# Patient Record
Sex: Male | Born: 1975 | Race: White | Hispanic: No | Smoking: Current every day smoker
Health system: Southern US, Community
[De-identification: ages and names within clinical notes are randomized; demographics above are authoritative.]

## PROBLEM LIST (undated history)

## (undated) DIAGNOSIS — K219 Gastro-esophageal reflux disease without esophagitis: Secondary | ICD-10-CM

## (undated) DIAGNOSIS — F419 Anxiety disorder, unspecified: Secondary | ICD-10-CM

## (undated) DIAGNOSIS — Z765 Malingerer [conscious simulation]: Secondary | ICD-10-CM

## (undated) DIAGNOSIS — T7840XA Allergy, unspecified, initial encounter: Secondary | ICD-10-CM

## (undated) DIAGNOSIS — E785 Hyperlipidemia, unspecified: Secondary | ICD-10-CM

## (undated) DIAGNOSIS — R7303 Prediabetes: Secondary | ICD-10-CM

## (undated) DIAGNOSIS — G4700 Insomnia, unspecified: Secondary | ICD-10-CM

## (undated) HISTORY — DX: Gastro-esophageal reflux disease without esophagitis: K21.9

## (undated) HISTORY — DX: Allergy, unspecified, initial encounter: T78.40XA

## (undated) HISTORY — DX: Hyperlipidemia, unspecified: E78.5

## (undated) HISTORY — DX: Anxiety disorder, unspecified: F41.9

---

## 1999-12-24 ENCOUNTER — Emergency Department (HOSPITAL_COMMUNITY): Admission: EM | Admit: 1999-12-24 | Discharge: 1999-12-24 | Payer: Self-pay | Admitting: Emergency Medicine

## 2000-03-13 ENCOUNTER — Emergency Department (HOSPITAL_COMMUNITY): Admission: EM | Admit: 2000-03-13 | Discharge: 2000-03-13 | Payer: Self-pay | Admitting: Emergency Medicine

## 2000-12-11 ENCOUNTER — Emergency Department (HOSPITAL_COMMUNITY): Admission: EM | Admit: 2000-12-11 | Discharge: 2000-12-11 | Payer: Self-pay | Admitting: Emergency Medicine

## 2001-02-20 ENCOUNTER — Emergency Department (HOSPITAL_COMMUNITY): Admission: EM | Admit: 2001-02-20 | Discharge: 2001-02-20 | Payer: Self-pay | Admitting: Emergency Medicine

## 2001-02-20 ENCOUNTER — Encounter: Payer: Self-pay | Admitting: Emergency Medicine

## 2001-11-20 ENCOUNTER — Emergency Department (HOSPITAL_COMMUNITY): Admission: EM | Admit: 2001-11-20 | Discharge: 2001-11-20 | Payer: Self-pay | Admitting: Emergency Medicine

## 2005-07-21 ENCOUNTER — Emergency Department (HOSPITAL_COMMUNITY): Admission: EM | Admit: 2005-07-21 | Discharge: 2005-07-21 | Payer: Self-pay | Admitting: Emergency Medicine

## 2006-02-25 ENCOUNTER — Emergency Department (HOSPITAL_COMMUNITY): Admission: EM | Admit: 2006-02-25 | Discharge: 2006-02-25 | Payer: Self-pay | Admitting: Emergency Medicine

## 2006-03-07 ENCOUNTER — Emergency Department: Payer: Self-pay | Admitting: Unknown Physician Specialty

## 2006-05-11 ENCOUNTER — Emergency Department: Payer: Self-pay | Admitting: Internal Medicine

## 2006-05-26 ENCOUNTER — Emergency Department (HOSPITAL_COMMUNITY): Admission: EM | Admit: 2006-05-26 | Discharge: 2006-05-26 | Payer: Self-pay | Admitting: Emergency Medicine

## 2006-07-08 ENCOUNTER — Emergency Department (HOSPITAL_COMMUNITY): Admission: EM | Admit: 2006-07-08 | Discharge: 2006-07-08 | Payer: Self-pay | Admitting: Emergency Medicine

## 2006-09-03 ENCOUNTER — Emergency Department (HOSPITAL_COMMUNITY): Admission: EM | Admit: 2006-09-03 | Discharge: 2006-09-03 | Payer: Self-pay | Admitting: Emergency Medicine

## 2006-09-24 ENCOUNTER — Emergency Department (HOSPITAL_COMMUNITY): Admission: EM | Admit: 2006-09-24 | Discharge: 2006-09-24 | Payer: Self-pay | Admitting: Emergency Medicine

## 2007-03-25 ENCOUNTER — Emergency Department (HOSPITAL_COMMUNITY): Admission: EM | Admit: 2007-03-25 | Discharge: 2007-03-25 | Payer: Self-pay | Admitting: Emergency Medicine

## 2007-03-26 ENCOUNTER — Emergency Department (HOSPITAL_COMMUNITY): Admission: EM | Admit: 2007-03-26 | Discharge: 2007-03-26 | Payer: Self-pay | Admitting: Emergency Medicine

## 2007-04-16 ENCOUNTER — Emergency Department (HOSPITAL_COMMUNITY): Admission: EM | Admit: 2007-04-16 | Discharge: 2007-04-16 | Payer: Self-pay | Admitting: Emergency Medicine

## 2007-04-28 ENCOUNTER — Emergency Department (HOSPITAL_COMMUNITY): Admission: EM | Admit: 2007-04-28 | Discharge: 2007-04-28 | Payer: Self-pay | Admitting: Emergency Medicine

## 2007-05-03 ENCOUNTER — Emergency Department (HOSPITAL_COMMUNITY): Admission: EM | Admit: 2007-05-03 | Discharge: 2007-05-03 | Payer: Self-pay | Admitting: Emergency Medicine

## 2007-06-03 ENCOUNTER — Emergency Department (HOSPITAL_COMMUNITY): Admission: EM | Admit: 2007-06-03 | Discharge: 2007-06-03 | Payer: Self-pay | Admitting: Emergency Medicine

## 2007-06-30 ENCOUNTER — Emergency Department (HOSPITAL_COMMUNITY): Admission: EM | Admit: 2007-06-30 | Discharge: 2007-06-30 | Payer: Self-pay | Admitting: Emergency Medicine

## 2007-07-03 ENCOUNTER — Emergency Department: Payer: Self-pay | Admitting: Emergency Medicine

## 2007-07-07 ENCOUNTER — Emergency Department (HOSPITAL_COMMUNITY): Admission: EM | Admit: 2007-07-07 | Discharge: 2007-07-07 | Payer: Self-pay | Admitting: Emergency Medicine

## 2007-08-01 ENCOUNTER — Emergency Department (HOSPITAL_COMMUNITY): Admission: EM | Admit: 2007-08-01 | Discharge: 2007-08-01 | Payer: Self-pay | Admitting: Emergency Medicine

## 2007-09-06 ENCOUNTER — Emergency Department (HOSPITAL_COMMUNITY): Admission: EM | Admit: 2007-09-06 | Discharge: 2007-09-06 | Payer: Self-pay | Admitting: Emergency Medicine

## 2007-10-04 ENCOUNTER — Emergency Department (HOSPITAL_COMMUNITY): Admission: EM | Admit: 2007-10-04 | Discharge: 2007-10-04 | Payer: Self-pay | Admitting: Emergency Medicine

## 2007-10-14 ENCOUNTER — Emergency Department: Payer: Self-pay | Admitting: Internal Medicine

## 2007-10-14 ENCOUNTER — Emergency Department (HOSPITAL_COMMUNITY): Admission: EM | Admit: 2007-10-14 | Discharge: 2007-10-14 | Payer: Self-pay | Admitting: Emergency Medicine

## 2007-11-07 ENCOUNTER — Emergency Department (HOSPITAL_COMMUNITY): Admission: EM | Admit: 2007-11-07 | Discharge: 2007-11-07 | Payer: Self-pay | Admitting: Emergency Medicine

## 2007-12-04 ENCOUNTER — Emergency Department (HOSPITAL_COMMUNITY): Admission: EM | Admit: 2007-12-04 | Discharge: 2007-12-04 | Payer: Self-pay | Admitting: Emergency Medicine

## 2008-01-12 ENCOUNTER — Emergency Department (HOSPITAL_COMMUNITY): Admission: EM | Admit: 2008-01-12 | Discharge: 2008-01-12 | Payer: Self-pay | Admitting: Emergency Medicine

## 2008-01-20 ENCOUNTER — Emergency Department (HOSPITAL_COMMUNITY): Admission: EM | Admit: 2008-01-20 | Discharge: 2008-01-20 | Payer: Self-pay | Admitting: Emergency Medicine

## 2008-01-30 ENCOUNTER — Emergency Department (HOSPITAL_COMMUNITY): Admission: EM | Admit: 2008-01-30 | Discharge: 2008-01-30 | Payer: Self-pay | Admitting: Emergency Medicine

## 2008-03-17 ENCOUNTER — Emergency Department (HOSPITAL_BASED_OUTPATIENT_CLINIC_OR_DEPARTMENT_OTHER): Admission: EM | Admit: 2008-03-17 | Discharge: 2008-03-17 | Payer: Self-pay | Admitting: Emergency Medicine

## 2008-03-18 ENCOUNTER — Emergency Department (HOSPITAL_BASED_OUTPATIENT_CLINIC_OR_DEPARTMENT_OTHER): Admission: EM | Admit: 2008-03-18 | Discharge: 2008-03-18 | Payer: Self-pay | Admitting: Emergency Medicine

## 2008-04-18 ENCOUNTER — Emergency Department (HOSPITAL_BASED_OUTPATIENT_CLINIC_OR_DEPARTMENT_OTHER): Admission: EM | Admit: 2008-04-18 | Discharge: 2008-04-18 | Payer: Self-pay | Admitting: Emergency Medicine

## 2008-05-22 ENCOUNTER — Emergency Department (HOSPITAL_BASED_OUTPATIENT_CLINIC_OR_DEPARTMENT_OTHER): Admission: EM | Admit: 2008-05-22 | Discharge: 2008-05-22 | Payer: Self-pay | Admitting: Emergency Medicine

## 2008-07-19 ENCOUNTER — Emergency Department (HOSPITAL_BASED_OUTPATIENT_CLINIC_OR_DEPARTMENT_OTHER): Admission: EM | Admit: 2008-07-19 | Discharge: 2008-07-19 | Payer: Self-pay | Admitting: Emergency Medicine

## 2008-07-31 ENCOUNTER — Emergency Department (HOSPITAL_BASED_OUTPATIENT_CLINIC_OR_DEPARTMENT_OTHER): Admission: EM | Admit: 2008-07-31 | Discharge: 2008-07-31 | Payer: Self-pay | Admitting: Emergency Medicine

## 2008-08-07 ENCOUNTER — Emergency Department (HOSPITAL_BASED_OUTPATIENT_CLINIC_OR_DEPARTMENT_OTHER): Admission: EM | Admit: 2008-08-07 | Discharge: 2008-08-07 | Payer: Self-pay | Admitting: Emergency Medicine

## 2008-08-10 ENCOUNTER — Emergency Department (HOSPITAL_BASED_OUTPATIENT_CLINIC_OR_DEPARTMENT_OTHER): Admission: EM | Admit: 2008-08-10 | Discharge: 2008-08-10 | Payer: Self-pay | Admitting: *Deleted

## 2008-10-02 ENCOUNTER — Emergency Department (HOSPITAL_BASED_OUTPATIENT_CLINIC_OR_DEPARTMENT_OTHER): Admission: EM | Admit: 2008-10-02 | Discharge: 2008-10-02 | Payer: Self-pay | Admitting: Emergency Medicine

## 2008-10-14 ENCOUNTER — Emergency Department (HOSPITAL_COMMUNITY): Admission: EM | Admit: 2008-10-14 | Discharge: 2008-10-14 | Payer: Self-pay | Admitting: Emergency Medicine

## 2008-11-27 ENCOUNTER — Emergency Department (HOSPITAL_BASED_OUTPATIENT_CLINIC_OR_DEPARTMENT_OTHER): Admission: EM | Admit: 2008-11-27 | Discharge: 2008-11-27 | Payer: Self-pay | Admitting: Emergency Medicine

## 2009-07-08 ENCOUNTER — Emergency Department (HOSPITAL_COMMUNITY): Admission: EM | Admit: 2009-07-08 | Discharge: 2009-07-08 | Payer: Self-pay | Admitting: Emergency Medicine

## 2009-09-03 ENCOUNTER — Emergency Department (HOSPITAL_COMMUNITY): Admission: EM | Admit: 2009-09-03 | Discharge: 2009-09-04 | Payer: Self-pay | Admitting: Emergency Medicine

## 2009-09-14 ENCOUNTER — Emergency Department (HOSPITAL_BASED_OUTPATIENT_CLINIC_OR_DEPARTMENT_OTHER): Admission: EM | Admit: 2009-09-14 | Discharge: 2009-09-14 | Payer: Self-pay | Admitting: Emergency Medicine

## 2009-09-25 ENCOUNTER — Emergency Department (HOSPITAL_COMMUNITY): Admission: EM | Admit: 2009-09-25 | Discharge: 2009-09-25 | Payer: Self-pay | Admitting: Emergency Medicine

## 2009-10-15 ENCOUNTER — Emergency Department (HOSPITAL_COMMUNITY): Admission: EM | Admit: 2009-10-15 | Discharge: 2009-10-15 | Payer: Self-pay | Admitting: Family Medicine

## 2009-10-21 ENCOUNTER — Emergency Department (HOSPITAL_COMMUNITY): Admission: EM | Admit: 2009-10-21 | Discharge: 2009-10-21 | Payer: Self-pay | Admitting: Emergency Medicine

## 2009-12-06 ENCOUNTER — Ambulatory Visit: Payer: Self-pay | Admitting: Family Medicine

## 2010-01-28 ENCOUNTER — Ambulatory Visit: Payer: Self-pay | Admitting: Diagnostic Radiology

## 2010-01-28 ENCOUNTER — Emergency Department (HOSPITAL_BASED_OUTPATIENT_CLINIC_OR_DEPARTMENT_OTHER): Admission: EM | Admit: 2010-01-28 | Discharge: 2010-01-29 | Payer: Self-pay | Admitting: Emergency Medicine

## 2010-03-21 ENCOUNTER — Emergency Department (HOSPITAL_COMMUNITY): Admission: EM | Admit: 2010-03-21 | Discharge: 2010-03-21 | Payer: Self-pay | Admitting: Emergency Medicine

## 2010-04-12 ENCOUNTER — Emergency Department (HOSPITAL_COMMUNITY): Admission: EM | Admit: 2010-04-12 | Discharge: 2010-04-12 | Payer: Self-pay | Admitting: Emergency Medicine

## 2010-04-23 ENCOUNTER — Emergency Department (HOSPITAL_COMMUNITY)
Admission: EM | Admit: 2010-04-23 | Discharge: 2010-04-23 | Payer: Self-pay | Source: Home / Self Care | Admitting: Emergency Medicine

## 2010-08-18 ENCOUNTER — Emergency Department (HOSPITAL_COMMUNITY): Admission: EM | Admit: 2010-08-18 | Discharge: 2010-01-08 | Payer: Self-pay | Admitting: Emergency Medicine

## 2010-09-29 ENCOUNTER — Observation Stay (HOSPITAL_COMMUNITY)
Admission: EM | Admit: 2010-09-29 | Discharge: 2010-09-30 | Payer: Self-pay | Source: Home / Self Care | Attending: Pulmonary Disease | Admitting: Pulmonary Disease

## 2010-10-02 ENCOUNTER — Encounter: Payer: Self-pay | Admitting: Emergency Medicine

## 2010-10-03 LAB — BASIC METABOLIC PANEL
Calcium: 9.2 mg/dL (ref 8.4–10.5)
Creatinine, Ser: 1.17 mg/dL (ref 0.4–1.5)
Glucose, Bld: 86 mg/dL (ref 70–99)

## 2010-10-03 LAB — POCT I-STAT, CHEM 8
Calcium, Ion: 1.06 mmol/L — ABNORMAL LOW (ref 1.12–1.32)
Creatinine, Ser: 1.4 mg/dL (ref 0.4–1.5)
Hemoglobin: 14.3 g/dL (ref 13.0–17.0)
Potassium: 4.5 mEq/L (ref 3.5–5.1)
Sodium: 137 mEq/L (ref 135–145)
TCO2: 27 mmol/L (ref 0–100)

## 2010-10-03 LAB — POCT CARDIAC MARKERS
CKMB, poc: <1 ng/mL — ABNORMAL LOW (ref 1.0–8.0)
Myoglobin, poc: 75.1 ng/mL (ref 12–200)
Myoglobin, poc: 69.4 ng/mL (ref 12–200)
Troponin i, poc: <0.05 ng/mL (ref 0.00–0.09)

## 2010-10-03 LAB — CBC
Hemoglobin: 15 g/dL (ref 13.0–17.0)
Platelets: 199 10*3/uL (ref 150–400)

## 2010-10-03 LAB — DIFFERENTIAL
Eosinophils Absolute: 0.6 10*3/uL (ref 0.0–0.7)
Eosinophils Relative: 6 % — ABNORMAL HIGH (ref 0–5)
Lymphocytes Relative: 29 % (ref 12–46)
Lymphs Abs: 2.7 10*3/uL (ref 0.7–4.0)
Monocytes Absolute: 0.7 10*3/uL (ref 0.1–1.0)
Monocytes Relative: 8 % (ref 3–12)
Neutrophils Relative %: 57 % (ref 43–77)

## 2010-10-07 ENCOUNTER — Emergency Department (HOSPITAL_COMMUNITY)
Admission: EM | Admit: 2010-10-07 | Discharge: 2010-10-07 | Payer: Self-pay | Source: Home / Self Care | Admitting: Emergency Medicine

## 2010-11-25 LAB — DIFFERENTIAL
Basophils Absolute: 0 10*3/uL (ref 0.0–0.1)
Eosinophils Absolute: 0.5 10*3/uL (ref 0.0–0.7)
Eosinophils Relative: 6 % — ABNORMAL HIGH (ref 0–5)

## 2010-11-25 LAB — CBC
MCH: 32.7 pg (ref 26.0–34.0)
MCV: 90.5 fL (ref 78.0–100.0)
RDW: 13.2 % (ref 11.5–15.5)
WBC: 8.5 10*3/uL (ref 4.0–10.5)

## 2010-11-25 LAB — BASIC METABOLIC PANEL
BUN: 7 mg/dL (ref 6–23)
CO2: 24 mEq/L (ref 19–32)
Calcium: 8.5 mg/dL (ref 8.4–10.5)
Chloride: 108 mEq/L (ref 96–112)
GFR calc Af Amer: >60 mL/min (ref >60)
Potassium: 3.6 mEq/L (ref 3.5–5.1)
Sodium: 140 mEq/L (ref 135–145)

## 2010-11-27 LAB — DIFFERENTIAL
Basophils Absolute: 0 10*3/uL (ref 0.0–0.1)
Eosinophils Absolute: 0.3 10*3/uL (ref 0.0–0.7)
Lymphocytes Relative: 9 % — ABNORMAL LOW (ref 12–46)
Lymphs Abs: 1.1 10*3/uL (ref 0.7–4.0)
Neutrophils Relative %: 81 % — ABNORMAL HIGH (ref 43–77)

## 2010-11-27 LAB — CBC
Platelets: 203 10*3/uL (ref 150–400)
RDW: 12.9 % (ref 11.5–15.5)
WBC: 12.6 10*3/uL — ABNORMAL HIGH (ref 4.0–10.5)

## 2010-11-27 LAB — POCT I-STAT, CHEM 8
BUN: 10 mg/dL (ref 6–23)
Creatinine, Ser: 1 mg/dL (ref 0.4–1.5)
Hemoglobin: 15.3 g/dL (ref 13.0–17.0)
Potassium: 3.6 mEq/L (ref 3.5–5.1)
Sodium: 137 mEq/L (ref 135–145)

## 2010-11-27 LAB — RAPID STREP SCREEN (MED CTR MEBANE ONLY): Streptococcus, Group A Screen (Direct): NEGATIVE

## 2010-11-28 LAB — DIFFERENTIAL
Lymphocytes Relative: 18 % (ref 12–46)
Lymphs Abs: 1.8 10*3/uL (ref 0.7–4.0)
Monocytes Absolute: 0.6 10*3/uL (ref 0.1–1.0)
Monocytes Relative: 5 % (ref 3–12)
Neutro Abs: 7.3 10*3/uL (ref 1.7–7.7)
Neutrophils Relative %: 71 % (ref 43–77)

## 2010-11-28 LAB — BASIC METABOLIC PANEL
CO2: 25 mEq/L (ref 19–32)
Calcium: 9 mg/dL (ref 8.4–10.5)
Creatinine, Ser: 1 mg/dL (ref 0.4–1.5)
GFR calc Af Amer: >60 mL/min (ref >60)
Sodium: 144 mEq/L (ref 135–145)

## 2010-11-28 LAB — CBC
Hemoglobin: 15.3 g/dL (ref 13.0–17.0)
MCHC: 34.3 g/dL (ref 30.0–36.0)
RBC: 4.8 MIL/uL (ref 4.22–5.81)
WBC: 10.3 10*3/uL (ref 4.0–10.5)

## 2010-11-29 LAB — CBC
HCT: 41.6 % (ref 39.0–52.0)
Hemoglobin: 14.8 g/dL (ref 13.0–17.0)
MCHC: 35.6 g/dL (ref 30.0–36.0)
RBC: 4.42 MIL/uL (ref 4.22–5.81)
RDW: 13.3 % (ref 11.5–15.5)

## 2010-11-29 LAB — DIFFERENTIAL
Basophils Absolute: 0.1 10*3/uL (ref 0.0–0.1)
Basophils Relative: 1 % (ref 0–1)
Eosinophils Relative: 5 % (ref 0–5)
Monocytes Absolute: 0.7 10*3/uL (ref 0.1–1.0)
Monocytes Relative: 7 % (ref 3–12)
Neutro Abs: 5.6 10*3/uL (ref 1.7–7.7)

## 2010-11-29 LAB — POCT CARDIAC MARKERS
CKMB, poc: <1 ng/mL — ABNORMAL LOW (ref 1.0–8.0)
CKMB, poc: <1 ng/mL — ABNORMAL LOW (ref 1.0–8.0)
Myoglobin, poc: 58.7 ng/mL (ref 12–200)
Troponin i, poc: <0.05 ng/mL (ref 0.00–0.09)
Troponin i, poc: <0.05 ng/mL (ref 0.00–0.09)

## 2010-11-29 LAB — BASIC METABOLIC PANEL
CO2: 26 mEq/L (ref 19–32)
Calcium: 9.2 mg/dL (ref 8.4–10.5)
Chloride: 106 mEq/L (ref 96–112)
GFR calc Af Amer: >60 mL/min (ref >60)
Glucose, Bld: 126 mg/dL — ABNORMAL HIGH (ref 70–99)
Potassium: 3.7 mEq/L (ref 3.5–5.1)
Sodium: 139 mEq/L (ref 135–145)

## 2010-12-22 LAB — URINALYSIS, ROUTINE W REFLEX MICROSCOPIC
Bilirubin Urine: NEGATIVE
Glucose, UA: NEGATIVE mg/dL
Hgb urine dipstick: NEGATIVE
Ketones, ur: NEGATIVE mg/dL
Nitrite: NEGATIVE
Protein, ur: NEGATIVE mg/dL
Specific Gravity, Urine: 1.005 (ref 1.005–1.030)
Urobilinogen, UA: 0.2 mg/dL (ref 0.0–1.0)
pH: 6 (ref 5.0–8.0)

## 2010-12-22 LAB — CBC
HCT: 46 % (ref 39.0–52.0)
Hemoglobin: 16 g/dL (ref 13.0–17.0)
MCHC: 34.7 g/dL (ref 30.0–36.0)
MCV: 92.7 fL (ref 78.0–100.0)
Platelets: 192 10*3/uL (ref 150–400)
RBC: 4.97 MIL/uL (ref 4.22–5.81)
RDW: 12.9 % (ref 11.5–15.5)
WBC: 10.8 10*3/uL — ABNORMAL HIGH (ref 4.0–10.5)

## 2010-12-22 LAB — COMPREHENSIVE METABOLIC PANEL
ALT: 30 U/L (ref 0–53)
AST: 25 U/L (ref 0–37)
Albumin: 4.4 g/dL (ref 3.5–5.2)
Alkaline Phosphatase: 87 U/L (ref 39–117)
BUN: 7 mg/dL (ref 6–23)
CO2: 28 mEq/L (ref 19–32)
Calcium: 9.2 mg/dL (ref 8.4–10.5)
Chloride: 105 mEq/L (ref 96–112)
Creatinine, Ser: 1 mg/dL (ref 0.4–1.5)
GFR calc Af Amer: >60 mL/min (ref >60)
GFR calc non Af Amer: >60 mL/min (ref >60)
Glucose, Bld: 97 mg/dL (ref 70–99)
Potassium: 3.6 mEq/L (ref 3.5–5.1)
Sodium: 141 mEq/L (ref 135–145)
Total Bilirubin: 0.6 mg/dL (ref 0.3–1.2)
Total Protein: 7.7 g/dL (ref 6.0–8.3)

## 2010-12-22 LAB — DIFFERENTIAL
Basophils Absolute: 0.1 10*3/uL (ref 0.0–0.1)
Lymphocytes Relative: 10 % — ABNORMAL LOW (ref 12–46)
Lymphs Abs: 1.1 10*3/uL (ref 0.7–4.0)
Monocytes Absolute: 0.5 10*3/uL (ref 0.1–1.0)
Monocytes Relative: 4 % (ref 3–12)
Neutro Abs: 8.6 10*3/uL — ABNORMAL HIGH (ref 1.7–7.7)

## 2010-12-22 LAB — LIPASE, BLOOD: Lipase: 44 U/L (ref 23–300)

## 2010-12-22 LAB — HEMOCCULT GUIAC POC 1CARD (OFFICE): Fecal Occult Bld: NEGATIVE

## 2010-12-26 LAB — GLUCOSE, CAPILLARY

## 2011-04-16 ENCOUNTER — Observation Stay (HOSPITAL_COMMUNITY)
Admission: EM | Admit: 2011-04-16 | Discharge: 2011-04-17 | Discharge status: Against Medical Advice | Payer: Self-pay | Attending: Emergency Medicine | Admitting: Emergency Medicine

## 2011-04-16 ENCOUNTER — Emergency Department (HOSPITAL_COMMUNITY): Payer: Self-pay

## 2011-04-16 DIAGNOSIS — R61 Generalized hyperhidrosis: Secondary | ICD-10-CM | POA: Insufficient documentation

## 2011-04-16 DIAGNOSIS — R110 Nausea: Secondary | ICD-10-CM | POA: Insufficient documentation

## 2011-04-16 DIAGNOSIS — R0609 Other forms of dyspnea: Secondary | ICD-10-CM | POA: Insufficient documentation

## 2011-04-16 DIAGNOSIS — R0989 Other specified symptoms and signs involving the circulatory and respiratory systems: Secondary | ICD-10-CM | POA: Insufficient documentation

## 2011-04-16 DIAGNOSIS — R079 Chest pain, unspecified: Principal | ICD-10-CM | POA: Insufficient documentation

## 2011-04-16 LAB — BASIC METABOLIC PANEL
BUN: 13 mg/dL (ref 6–23)
CO2: 26 mEq/L (ref 19–32)
Calcium: 9.5 mg/dL (ref 8.4–10.5)
Chloride: 102 mEq/L (ref 96–112)
Creatinine, Ser: 0.9 mg/dL (ref 0.50–1.35)

## 2011-04-16 LAB — TROPONIN I
Troponin I: <0.3 ng/mL (ref <0.30)
Troponin I: <0.3 ng/mL (ref <0.30)

## 2011-04-16 LAB — URINALYSIS, ROUTINE W REFLEX MICROSCOPIC
Bilirubin Urine: NEGATIVE
Glucose, UA: NEGATIVE mg/dL
Hgb urine dipstick: NEGATIVE
Urobilinogen, UA: 0.2 mg/dL (ref 0.0–1.0)

## 2011-04-16 LAB — CBC
Hemoglobin: 15.3 g/dL (ref 13.0–17.0)
MCHC: 36.5 g/dL — ABNORMAL HIGH (ref 30.0–36.0)
WBC: 8.3 10*3/uL (ref 4.0–10.5)

## 2011-04-16 LAB — TSH: TSH: 2.041 u[IU]/mL (ref 0.350–4.500)

## 2011-04-16 LAB — D-DIMER, QUANTITATIVE: D-Dimer, Quant: <0.22 ug/mL-FEU (ref 0.00–0.48)

## 2011-04-16 LAB — T4, FREE: Free T4: 0.99 ng/dL (ref 0.80–1.80)

## 2011-04-16 LAB — CK TOTAL AND CKMB (NOT AT ARMC): Relative Index: INVALID (ref 0.0–2.5)

## 2011-04-17 LAB — CK TOTAL AND CKMB (NOT AT ARMC)
CK, MB: 1.9 ng/mL (ref 0.3–4.0)
Total CK: 87 U/L (ref 7–232)

## 2011-06-01 LAB — URINALYSIS, ROUTINE W REFLEX MICROSCOPIC
Bilirubin Urine: NEGATIVE
Glucose, UA: NEGATIVE
Hgb urine dipstick: NEGATIVE
Ketones, ur: NEGATIVE
Protein, ur: NEGATIVE
pH: 6

## 2011-06-01 LAB — POCT CARDIAC MARKERS
CKMB, poc: <1 — ABNORMAL LOW
Troponin i, poc: <0.05

## 2011-06-07 LAB — POCT CARDIAC MARKERS
CKMB, poc: 1.1
Myoglobin, poc: 99.7
Operator id: 294501
Troponin i, poc: <0.05

## 2011-06-08 LAB — BASIC METABOLIC PANEL
BUN: 12
Creatinine, Ser: 1
GFR calc non Af Amer: >60
Glucose, Bld: 114 — ABNORMAL HIGH
Potassium: 4

## 2011-06-08 LAB — POCT CARDIAC MARKERS
CKMB, poc: 1.5
Operator id: 1206

## 2011-06-08 LAB — DIFFERENTIAL
Basophils Absolute: 0
Eosinophils Absolute: 0.5
Eosinophils Relative: 5
Lymphocytes Relative: 28
Neutrophils Relative %: 60

## 2011-06-08 LAB — CBC
HCT: 44
Platelets: 203
RDW: 12.7
WBC: 8.9

## 2011-06-08 LAB — D-DIMER, QUANTITATIVE: D-Dimer, Quant: <0.22

## 2011-06-09 LAB — CBC
HCT: 45.9
MCHC: 35.2
MCV: 91.1
Platelets: 206
RDW: 12.4

## 2011-06-09 LAB — BASIC METABOLIC PANEL
BUN: 11
Chloride: 104
Creatinine, Ser: 1.1
Glucose, Bld: 111 — ABNORMAL HIGH
Potassium: 3.6

## 2011-06-09 LAB — DIFFERENTIAL
Basophils Absolute: 0
Basophils Relative: 0
Eosinophils Absolute: 0.5
Eosinophils Relative: 4

## 2011-06-09 LAB — D-DIMER, QUANTITATIVE: D-Dimer, Quant: <0.22

## 2011-06-14 LAB — URINALYSIS, ROUTINE W REFLEX MICROSCOPIC
Bilirubin Urine: NEGATIVE
Glucose, UA: NEGATIVE
Hgb urine dipstick: NEGATIVE
Specific Gravity, Urine: 1.004 — ABNORMAL LOW
Urobilinogen, UA: 0.2
pH: 6

## 2011-06-14 LAB — DIFFERENTIAL
Basophils Relative: 1
Eosinophils Absolute: 0.6
Lymphs Abs: 2.1
Neutrophils Relative %: 61

## 2011-06-14 LAB — CBC
HCT: 40.3
MCHC: 35.2
MCV: 91.9
Platelets: 183
RDW: 12.3
WBC: 8.6

## 2011-06-14 LAB — LIPASE, BLOOD: Lipase: 60

## 2011-06-14 LAB — OCCULT BLOOD X 1 CARD TO LAB, STOOL: Fecal Occult Bld: NEGATIVE

## 2011-06-14 LAB — COMPREHENSIVE METABOLIC PANEL
Albumin: 4.4
BUN: 12
Chloride: 106
Creatinine, Ser: 1
Total Bilirubin: 0.4

## 2011-06-20 LAB — DIFFERENTIAL
Basophils Relative: 1
Eosinophils Absolute: 0.4
Lymphs Abs: 1.4
Monocytes Absolute: 0.7
Monocytes Relative: 7
Neutrophils Relative %: 75

## 2011-06-20 LAB — BASIC METABOLIC PANEL
CO2: 25
Chloride: 105
Creatinine, Ser: 0.89
GFR calc Af Amer: >60
Potassium: 3.9
Sodium: 137

## 2011-06-20 LAB — CBC
HCT: 44
Hemoglobin: 15.2
MCHC: 34.5
MCV: 93.1
RBC: 4.73
WBC: 10.2

## 2011-06-20 LAB — POCT CARDIAC MARKERS
Operator id: 146091
Troponin i, poc: <0.05

## 2011-06-21 LAB — URINALYSIS, ROUTINE W REFLEX MICROSCOPIC
Glucose, UA: NEGATIVE
Hgb urine dipstick: NEGATIVE
pH: 6.5

## 2011-06-21 LAB — DIFFERENTIAL
Eosinophils Relative: 8 — ABNORMAL HIGH
Lymphocytes Relative: 25
Lymphs Abs: 2.6
Monocytes Relative: 6
Neutrophils Relative %: 60

## 2011-06-21 LAB — CBC
HCT: 43
Platelets: 236
RBC: 4.66
WBC: 10.5

## 2011-06-22 LAB — URINALYSIS, ROUTINE W REFLEX MICROSCOPIC
Nitrite: NEGATIVE
Specific Gravity, Urine: 1.005
pH: 6.5

## 2011-06-26 LAB — BASIC METABOLIC PANEL
BUN: 6
GFR calc Af Amer: >60
GFR calc non Af Amer: >60
Potassium: 3.8
Sodium: 141

## 2011-06-26 LAB — CBC
HCT: 43.1
Hemoglobin: 15.3
Platelets: 224
RBC: 4.8
WBC: 9.6

## 2011-06-26 LAB — DIFFERENTIAL
Eosinophils Relative: 2
Lymphocytes Relative: 21
Lymphs Abs: 2

## 2011-06-26 LAB — POCT CARDIAC MARKERS
Operator id: 4761
Troponin i, poc: <0.05

## 2011-06-27 LAB — POCT CARDIAC MARKERS
CKMB, poc: <1 — ABNORMAL LOW
Operator id: 277751
Troponin i, poc: <0.05

## 2011-06-27 LAB — CBC
HCT: 46.3
Platelets: 250
WBC: 12 — ABNORMAL HIGH

## 2011-06-27 LAB — D-DIMER, QUANTITATIVE: D-Dimer, Quant: <0.22

## 2011-06-27 LAB — DIFFERENTIAL
Eosinophils Relative: 4
Lymphocytes Relative: 36
Lymphs Abs: 4.3 — ABNORMAL HIGH
Neutrophils Relative %: 54

## 2011-07-03 ENCOUNTER — Encounter: Payer: Self-pay | Admitting: Family Medicine

## 2011-10-15 ENCOUNTER — Encounter (HOSPITAL_COMMUNITY): Payer: Self-pay | Admitting: Emergency Medicine

## 2011-10-15 ENCOUNTER — Emergency Department (HOSPITAL_COMMUNITY)
Admission: EM | Admit: 2011-10-15 | Discharge: 2011-10-15 | Disposition: A | Discharge status: Home / Self Care | Payer: Self-pay | Attending: Emergency Medicine | Admitting: Emergency Medicine

## 2011-10-15 ENCOUNTER — Emergency Department (HOSPITAL_COMMUNITY): Payer: Self-pay

## 2011-10-15 DIAGNOSIS — K219 Gastro-esophageal reflux disease without esophagitis: Secondary | ICD-10-CM | POA: Insufficient documentation

## 2011-10-15 DIAGNOSIS — H538 Other visual disturbances: Secondary | ICD-10-CM | POA: Insufficient documentation

## 2011-10-15 DIAGNOSIS — G4700 Insomnia, unspecified: Secondary | ICD-10-CM | POA: Insufficient documentation

## 2011-10-15 DIAGNOSIS — R7303 Prediabetes: Secondary | ICD-10-CM | POA: Insufficient documentation

## 2011-10-15 DIAGNOSIS — R631 Polydipsia: Secondary | ICD-10-CM | POA: Insufficient documentation

## 2011-10-15 DIAGNOSIS — F411 Generalized anxiety disorder: Secondary | ICD-10-CM | POA: Insufficient documentation

## 2011-10-15 DIAGNOSIS — R51 Headache: Secondary | ICD-10-CM | POA: Insufficient documentation

## 2011-10-15 DIAGNOSIS — Z79899 Other long term (current) drug therapy: Secondary | ICD-10-CM | POA: Insufficient documentation

## 2011-10-15 DIAGNOSIS — R209 Unspecified disturbances of skin sensation: Secondary | ICD-10-CM | POA: Insufficient documentation

## 2011-10-15 DIAGNOSIS — E785 Hyperlipidemia, unspecified: Secondary | ICD-10-CM | POA: Insufficient documentation

## 2011-10-15 HISTORY — DX: Gastro-esophageal reflux disease without esophagitis: K21.9

## 2011-10-15 HISTORY — DX: Insomnia, unspecified: G47.00

## 2011-10-15 HISTORY — DX: Prediabetes: R73.03

## 2011-10-15 LAB — CBC
HCT: 39.2 % (ref 39.0–52.0)
Hemoglobin: 14.4 g/dL (ref 13.0–17.0)
MCH: 32.5 pg (ref 26.0–34.0)
MCHC: 36.7 g/dL — ABNORMAL HIGH (ref 30.0–36.0)
MCV: 88.5 fL (ref 78.0–100.0)
Platelets: 184 10*3/uL (ref 150–400)
RBC: 4.43 MIL/uL (ref 4.22–5.81)
RDW: 12.8 % (ref 11.5–15.5)
WBC: 9 10*3/uL (ref 4.0–10.5)

## 2011-10-15 LAB — COMPREHENSIVE METABOLIC PANEL
ALT: 30 U/L (ref 0–53)
AST: 7 U/L (ref 0–37)
Albumin: 3.7 g/dL (ref 3.5–5.2)
Alkaline Phosphatase: 87 U/L (ref 39–117)
BUN: 14 mg/dL (ref 6–23)
CO2: 23 mEq/L (ref 19–32)
Calcium: 9.1 mg/dL (ref 8.4–10.5)
Chloride: 99 mEq/L (ref 96–112)
Creatinine, Ser: 0.94 mg/dL (ref 0.50–1.35)
GFR calc Af Amer: >90 mL/min (ref >90)
GFR calc non Af Amer: >90 mL/min (ref >90)
Glucose, Bld: 104 mg/dL — ABNORMAL HIGH (ref 70–99)
Potassium: 3.7 mEq/L (ref 3.5–5.1)
Sodium: 135 mEq/L (ref 135–145)
Total Bilirubin: 0.3 mg/dL (ref 0.3–1.2)
Total Protein: 6.6 g/dL (ref 6.0–8.3)

## 2011-10-15 LAB — URINALYSIS, ROUTINE W REFLEX MICROSCOPIC
Bilirubin Urine: NEGATIVE
Glucose, UA: NEGATIVE mg/dL
Hgb urine dipstick: NEGATIVE
Ketones, ur: NEGATIVE mg/dL
Leukocytes, UA: NEGATIVE
Nitrite: NEGATIVE
Protein, ur: NEGATIVE mg/dL
Specific Gravity, Urine: 1.007 (ref 1.005–1.030)
Urobilinogen, UA: 0.2 mg/dL (ref 0.0–1.0)
pH: 6 (ref 5.0–8.0)

## 2011-10-15 LAB — DIFFERENTIAL
Basophils Absolute: 0 10*3/uL (ref 0.0–0.1)
Basophils Relative: 0 % (ref 0–1)
Eosinophils Absolute: 0.4 10*3/uL (ref 0.0–0.7)
Eosinophils Relative: 5 % (ref 0–5)
Lymphocytes Relative: 28 % (ref 12–46)
Lymphs Abs: 2.5 10*3/uL (ref 0.7–4.0)
Monocytes Absolute: 0.8 10*3/uL (ref 0.1–1.0)
Monocytes Relative: 9 % (ref 3–12)
Neutro Abs: 5.2 10*3/uL (ref 1.7–7.7)
Neutrophils Relative %: 58 % (ref 43–77)

## 2011-10-15 MED ORDER — ACETAMINOPHEN 500 MG PO TABS
1000.0000 mg | ORAL_TABLET | ORAL | Status: AC
  Administered 2011-10-15: 1000 mg via ORAL
  Filled 2011-10-15: qty 2

## 2011-10-15 MED ORDER — KETOROLAC TROMETHAMINE 30 MG/ML IJ SOLN
30.0000 mg | Freq: Once | INTRAMUSCULAR | Status: AC
  Administered 2011-10-15: 30 mg via INTRAMUSCULAR
  Filled 2011-10-15: qty 1

## 2011-10-15 MED ORDER — SODIUM CHLORIDE 0.9 % IV BOLUS (SEPSIS)
1000.0000 mL | Freq: Once | INTRAVENOUS | Status: AC
  Administered 2011-10-15: 1000 mL via INTRAVENOUS

## 2011-10-15 NOTE — ED Notes (Signed)
Patient reporting hyperglycemia-like symptoms, such as increased thirst and increased urination (patient reports that he is a pre-diabetic).  Reports a sharp pain with numbness/tingling down left leg and a severe headache upon waking up this morning, and feels that this is due to his blood sugar.  Hand grips and foot pushes bilaterally equal and strong; no facial droop present.

## 2011-10-15 NOTE — ED Notes (Signed)
CBG- 99 

## 2011-10-15 NOTE — ED Provider Notes (Addendum)
Medical screening examination/treatment/procedure(s) were conducted as a shared visit with non-physician practitioner(s) and myself.  I personally evaluated the patient during the encounter  The patient has a constellation of symptoms including head and left leg pain. His evaluation ED is negative for acute or urgent medical problems. He is stable for discharge with symptomatic treatment   Date: 10/15/2011  Rate: 67  Rhythm: normal sinus rhythm  QRS Axis: normal  Intervals: normal  ST/T Wave abnormalities: normal  Conduction Disutrbances:none  Narrative Interpretation:   Old EKG Reviewed: unchanged    Flint Melter, MD 10/15/11 4098  Flint Melter, MD 10/15/11 [phone]

## 2011-10-15 NOTE — ED Notes (Signed)
Patient back from CT.

## 2011-10-15 NOTE — ED Provider Notes (Signed)
History     CSN: [id number]  Arrival date & time 10/15/11  0420   First MD Initiated Contact with Patient 10/15/11 [phone]      Chief Complaint  Patient presents with  . Hyperglycemia    (Consider location/radiation/quality/duration/timing/severity/associated sxs/prior treatment) HPI Patient states that he awoke this morning around 145 to 2 AM with a headache and numbness in the left side of his body and he states blurred vision.  He states he has had a recent increase in urination and thirst.  He states that he has a left leg sharp pain that radiates up and down his leg.  Patient seems very anxious that he could be having a stroke.  Patient denies chest pain, shortness of breath, weakness, gait disturbance, abdominal pain, nausea/vomiting, or diarrhea.  Past Medical History  Diagnosis Date  . Anxiety   . Asthma   . GERD (gastroesophageal reflux disease)   . Allergy     RHINITIS  . Hyperlipidemia   . Pre-diabetes   . Acid reflux   . Insomnia     History reviewed. No pertinent past surgical history.  Family History  Problem Relation Age of Onset  . Heart disease Father     CORNARY HEART DISEASE/ 2 BYPASS SURGERIES  . Cancer Father     PROSTATE  . Heart disease Paternal Grandfather     CORNARY HEART DISEASE  . Arthritis Paternal Grandfather     History  Substance Use Topics  . Smoking status: Not on file  . Smokeless tobacco: Not on file  . Alcohol Use:       Review of Systems All pertinent positives and negatives reviewed in the history of present illness  Allergies  Review of patient's allergies indicates no known allergies.  Home Medications   Current Outpatient Rx  Name Route Sig Dispense Refill  . ACETAMINOPHEN 500 MG PO TABS Oral Take 1,000 mg by mouth every 12 (twelve) hours as needed. As needed for pain    . ALBUTEROL SULFATE HFA 108 (90 BASE) MCG/ACT IN AERS Inhalation Inhale 2 puffs into the lungs every 6 (six) hours as needed.      . ASPIRIN 81 MG  PO TABS Oral Take 81 mg by mouth daily.      Marland Kitchen LORAZEPAM 0.5 MG PO TABS Oral Take 0.5 mg by mouth every 8 (eight) hours.      . OMEPRAZOLE 40 MG PO CPDR Oral Take 40 mg by mouth daily.    Marland Kitchen RANITIDINE HCL 150 MG PO TABS Oral Take 150 mg by mouth 2 (two) times daily.        BP 122/71  Pulse 83  Temp(Src) 97.8 F (36.6 C) (Oral)  Resp 18  SpO2 95%  Physical Exam  Constitutional: He is oriented to person, place, and time. He appears well-developed and well-nourished. No distress.  HENT:  Head: Normocephalic and atraumatic.  Eyes: EOM are normal. Pupils are equal, round, and reactive to light.  Neck: Normal range of motion. Neck supple.  Cardiovascular: Normal rate, regular rhythm and normal heart sounds.  Exam reveals no gallop and no friction rub.   No murmur heard. Pulmonary/Chest: Effort normal and breath sounds normal. No respiratory distress. He has no wheezes. He has no rales.  Abdominal: Soft. Bowel sounds are normal. He exhibits no distension. There is no tenderness. There is no rebound and no guarding.  Neurological: He is alert and oriented to person, place, and time. He exhibits normal muscle tone. Coordination normal.  Patient has normal strength in all 4 extremities and equal handgrip.  He has normal fine motor function on finger-nose testing and heel-to-shin.  Patient has a facial droop noted on exam.  Cranial nerves are intact.  Skin: Skin is warm and dry. No rash noted.  Psychiatric: His mood appears anxious.    ED Course  Procedures (including critical care time)  Labs Reviewed  CBC - Abnormal; Notable for the following:    MCHC 36.7 (*)    All other components within normal limits  GLUCOSE, CAPILLARY  DIFFERENTIAL  COMPREHENSIVE METABOLIC PANEL  URINALYSIS, ROUTINE W REFLEX MICROSCOPIC   Of it with patient for this numbness and headache along with his increased thirst and urination.  Patient is stable at this time he is ambulating without difficulty in the  emergency department.       MDM    MDM Reviewed: nursing note and vitals Reviewed previous: labs and CT scan          Carlyle Dolly, PA-C 10/18/11 [phone]

## 2011-10-15 NOTE — ED Provider Notes (Signed)
6:13 AM Signout received from [place], PA-C. Awaiting CT head. Plan to manage pain. Anticipate discharge to home.  7:28 AM Negative head CT. Labs including CBC, CMP, UA reviewed and unremarkable. Discussed findings with pt. He states that pain has improved but is still present. Will remedicate with Toradol prior to dc. Return precautions discussed.  [place], [place] 10/15/11 [phone]

## 2011-10-15 NOTE — ED Provider Notes (Signed)
Medical screening examination/treatment/procedure(s) were conducted as a shared visit with non-physician practitioner(s) and myself.  I personally evaluated the patient during the encounter  Flint Melter, MD 10/15/11 [phone]

## 2011-10-15 NOTE — ED Notes (Signed)
Patient to restroom, giving urine sample.

## 2011-10-18 NOTE — ED Provider Notes (Signed)
Medical screening examination/treatment/procedure(s) were conducted as a shared visit with non-physician practitioner(s) and myself.  I personally evaluated the patient during the encounter  Flint Melter, MD 10/18/11 [phone]

## 2011-11-13 DIAGNOSIS — J45909 Unspecified asthma, uncomplicated: Secondary | ICD-10-CM | POA: Insufficient documentation

## 2011-12-25 ENCOUNTER — Emergency Department (HOSPITAL_COMMUNITY)
Admission: EM | Admit: 2011-12-25 | Discharge: 2011-12-25 | Disposition: A | Discharge status: Home / Self Care | Payer: Self-pay | Attending: Emergency Medicine | Admitting: Emergency Medicine

## 2011-12-25 ENCOUNTER — Encounter (HOSPITAL_COMMUNITY): Payer: Self-pay | Admitting: Emergency Medicine

## 2011-12-25 DIAGNOSIS — J029 Acute pharyngitis, unspecified: Secondary | ICD-10-CM | POA: Insufficient documentation

## 2011-12-25 DIAGNOSIS — R6883 Chills (without fever): Secondary | ICD-10-CM | POA: Insufficient documentation

## 2011-12-25 LAB — RAPID STREP SCREEN (MED CTR MEBANE ONLY): Streptococcus, Group A Screen (Direct): NEGATIVE

## 2011-12-25 MED ORDER — IBUPROFEN 800 MG PO TABS
800.0000 mg | ORAL_TABLET | Freq: Once | ORAL | Status: AC
  Administered 2011-12-25: 800 mg via ORAL
  Filled 2011-12-25: qty 1

## 2011-12-25 NOTE — ED Notes (Signed)
Pt states he thinks he has an infection in his throat but it has been going on since Feb.  Pt states back in Feb and his right lymph node swelled up  He went to his family dr and was given an antibiotic  Pt states recently the right side has been swollen, he has been fatigued, having chills, and thinks he had a "seizure" the other night  States he was trying to talk but he could not  Pt states now he is afraid to go to sleep  Pt states he thinks it has something to do with smoking

## 2011-12-25 NOTE — Discharge Instructions (Signed)
Return to the ED with any concerns including dificulty swallowing or breathing, vomiting and not able to keep down liquids, or any other alarming symptoms  You should be sure to increase your fluid intake as well as take ibuprofen for pain

## 2011-12-29 NOTE — ED Provider Notes (Signed)
History     CSN: [id number]  Arrival date & time 12/25/11  0865   First MD Initiated Contact with Patient 12/25/11 2024      Chief Complaint  Patient presents with  . Sore Throat    (Consider location/radiation/quality/duration/timing/severity/associated sxs/prior treatment) HPI Patient presents with complaint of sore throat. He also feels that there is an area of swelling in the right side of his neck underneath his jaw. He states he has had an infection in his throat off and on since February. He was initially treated by his primary doctor with an antibiotic and symptoms improved. Current illness has been going on for the past one to 2 days. He has had pain with swallowing as well as some fatigue. He does not know of any fever or. 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 He's had no vomiting or difficulty breathing. He has been able to drink liquids without difficulty. He has not been on any recent antibiotics. He has anxiety surrounding his symptoms and states that now he is afraid to go to sleep.  There are no associated symptoms.  There are no alleviating or modifying factors.    Past Medical History  Diagnosis Date  . Anxiety   . Asthma   . GERD (gastroesophageal reflux disease)   . Allergy     RHINITIS  . Hyperlipidemia   . Pre-diabetes   . Acid reflux   . Insomnia     History reviewed. No pertinent past surgical history.  Family History  Problem Relation Age of Onset  . Heart disease Father     CORNARY HEART DISEASE/ 2 BYPASS SURGERIES  . Cancer Father     PROSTATE  . Heart disease Paternal Grandfather     CORNARY HEART DISEASE  . Arthritis Paternal Grandfather     History  Substance Use Topics  . Smoking status: Current Everyday Smoker    Types: Cigarettes  . Smokeless tobacco: Not on file  . Alcohol Use: No      Review of Systems ROS reviewed and all otherwise negative except for mentioned  in HPI  Allergies  Review of patient's allergies indicates no known allergies.  Home Medications   Current Outpatient Rx  Name Route Sig Dispense Refill  . ACETAMINOPHEN 500 MG PO TABS Oral Take 1,000 mg by mouth every 12 (twelve) hours as needed. As needed for pain    . ALBUTEROL SULFATE HFA 108 (90 BASE) MCG/ACT IN AERS Inhalation Inhale 2 puffs into the lungs every 6 (six) hours as needed.      Marland Kitchen DIPHENHYDRAMINE HCL 25 MG PO TABS Oral Take 25 mg by mouth every 6 (six) hours as needed. For seasonal allergy relief    . IBUPROFEN 200 MG PO TABS Oral Take 200 mg by mouth every 6 (six) hours as needed. For pain relief    . LORAZEPAM 0.5 MG PO TABS Oral Take 0.5 mg by mouth every 8 (eight) hours.      . ADULT MULTIVITAMIN W/MINERALS CH Oral Take 1 tablet by mouth daily.    Marland Kitchen OMEPRAZOLE 40 MG PO CPDR Oral Take 40 mg by mouth daily.    Marland Kitchen PSEUDOEPHEDRINE HCL 30 MG PO TABS Oral Take 30 mg by mouth every 4 (four) hours as needed. For sinus congestion relief    . RANITIDINE HCL 150 MG PO TABS Oral Take 150 mg by mouth 2 (two) times daily.      Marland Kitchen  ASPIRIN 81 MG PO TABS Oral Take 81 mg by mouth daily.        BP 123/80  Pulse 73  Temp(Src) 98.6 F (37 C) (Oral)  Resp 16  Ht 5\' 11"  (1.803 m)  Wt 218 lb (98.884 kg)  BMI 30.40 kg/m2  SpO2 98% Vitals reviewed Physical Exam Physical Examination: General appearance - alert, well appearing, and in no distress Mental status - alert, oriented to person, place, and time Eyes - pupils equal and reactive, no conjunctival injection Mouth - MMM, mild erythema of OP, palate symmetric, uvula midline, no exudate Neck - supple, mild bilateral shotty anterior cervical LAD Chest - clear to auscultation, no wheezes, rales or rhonchi, symmetric air entry Heart - normal rate, regular rhythm, normal S1, S2, no murmurs, rubs, clicks or gallops Abdomen - soft, nontender, nondistended, no masses or organomegaly Extremities - peripheral pulses normal, no pedal edema,  no clubbing or cyanosis Skin - normal coloration and turgor, no rashes, brisk cap refill Psych- mildly anxious  ED Course  Procedures (including critical care time)   Labs Reviewed  RAPID STREP SCREEN  LAB REPORT - SCANNED   No results found.   1. Pharyngitis       MDM  Patient presenting with pharyngitis. His rapid strep test was negative and has no significant lymphadenopathy or asymmetry to his examination. He is overall nontoxic and his examination is reassuring. I have tried my best to reassure patient about his concerns and suspect viral pharyngitis.  He was discharged with strict return precautions and verbalized understanding and agreement with this plan.         Ethelda Chick, MD 12/29/11 [phone]

## 2011-12-30 ENCOUNTER — Emergency Department (HOSPITAL_COMMUNITY)
Admission: EM | Admit: 2011-12-30 | Discharge: 2011-12-30 | Disposition: A | Discharge status: Home / Self Care | Payer: Self-pay | Attending: Emergency Medicine | Admitting: Emergency Medicine

## 2011-12-30 ENCOUNTER — Encounter (HOSPITAL_COMMUNITY): Payer: Self-pay | Admitting: Emergency Medicine

## 2011-12-30 DIAGNOSIS — F411 Generalized anxiety disorder: Secondary | ICD-10-CM | POA: Insufficient documentation

## 2011-12-30 DIAGNOSIS — R220 Localized swelling, mass and lump, head: Secondary | ICD-10-CM | POA: Insufficient documentation

## 2011-12-30 DIAGNOSIS — Z79899 Other long term (current) drug therapy: Secondary | ICD-10-CM | POA: Insufficient documentation

## 2011-12-30 DIAGNOSIS — F17200 Nicotine dependence, unspecified, uncomplicated: Secondary | ICD-10-CM | POA: Insufficient documentation

## 2011-12-30 DIAGNOSIS — H9209 Otalgia, unspecified ear: Secondary | ICD-10-CM | POA: Insufficient documentation

## 2011-12-30 DIAGNOSIS — K029 Dental caries, unspecified: Secondary | ICD-10-CM | POA: Insufficient documentation

## 2011-12-30 DIAGNOSIS — J45909 Unspecified asthma, uncomplicated: Secondary | ICD-10-CM | POA: Insufficient documentation

## 2011-12-30 DIAGNOSIS — R6884 Jaw pain: Secondary | ICD-10-CM | POA: Insufficient documentation

## 2011-12-30 DIAGNOSIS — K089 Disorder of teeth and supporting structures, unspecified: Secondary | ICD-10-CM | POA: Insufficient documentation

## 2011-12-30 DIAGNOSIS — K219 Gastro-esophageal reflux disease without esophagitis: Secondary | ICD-10-CM | POA: Insufficient documentation

## 2011-12-30 DIAGNOSIS — E785 Hyperlipidemia, unspecified: Secondary | ICD-10-CM | POA: Insufficient documentation

## 2011-12-30 DIAGNOSIS — K047 Periapical abscess without sinus: Secondary | ICD-10-CM | POA: Insufficient documentation

## 2011-12-30 MED ORDER — AMOXICILLIN 500 MG PO CAPS: 500.0000 mg | ORAL_CAPSULE | Freq: Three times a day (TID) | ORAL | Status: AC

## 2011-12-30 MED ORDER — AMOXICILLIN 500 MG PO CAPS
500.0000 mg | ORAL_CAPSULE | Freq: Once | ORAL | Status: AC
  Administered 2011-12-30: 500 mg via ORAL
  Filled 2011-12-30: qty 1

## 2011-12-30 MED ORDER — TRAMADOL HCL 50 MG PO TABS: 50.0000 mg | ORAL_TABLET | Freq: Four times a day (QID) | ORAL | Status: AC | PRN

## 2011-12-30 NOTE — ED Notes (Addendum)
Right bottom molar has pus and bleeding, per pt,  Pt states he was seen here on 15th for what he thought was a lymph node problem on that side,  Now he thinks it was related to this tooth.  Pt states he seen his dentist two months ago,  Pain is now going to right ear per patient

## 2011-12-30 NOTE — Discharge Instructions (Signed)
Abscessed Tooth A tooth abscess is a collection of infected fluid (pus) from a bacterial infection in the inner part of the tooth (pulp). It usually occurs at the end of the tooth's root.  CAUSES   A very bad cavity (extensive tooth decay).   Trauma to the tooth, such as a broken or chipped tooth, that allows bacteria to enter into the pulp.  SYMPTOMS  Severe pain in and around the infected tooth.   Swelling and redness around the abscessed tooth or in the mouth or face.   Tenderness.   Pus drainage.   Bad breath.   Bitter taste in the mouth.   Difficulty swallowing.   Difficulty opening the mouth.   Feeling sick to your stomach (nauseous).   Vomiting.   Chills.   Swollen neck glands.  DIAGNOSIS  A medical and dental history will be taken.   An examination will be performed by tapping on the abscessed tooth.   X-rays may be taken of the tooth to identify the abscess.  TREATMENT The goal of treatment is to eliminate the infection.   You may be prescribed antibiotic medicine to stop the infection from spreading.   A root canal may be performed to save the tooth. If the tooth cannot be saved, it may be pulled (extracted) and the abscess may be drained.  HOME CARE INSTRUCTIONS  Only take over-the-counter or prescription medicines for pain, fever, or discomfort as directed by your caregiver.   Do not drive after taking pain medicine (narcotics).   Rinse your mouth (gargle) often with salt water ( tsp salt in 8 oz of warm water) to relieve pain or swelling.   Do not apply heat to the outside of your face.   Return to your dentist for further treatment as directed.  SEEK IMMEDIATE DENTAL CARE IF:  You have a temperature by mouth above 102 F (38.9 C), not controlled by medicine.   You have chills or a very bad headache.   You have problems breathing or swallowing.   Your have trouble opening your mouth.   You develop swelling in the neck or around the eye.    Your pain is not helped by medicine.   Your pain is getting worse instead of better.  Document Released: 08/28/2005 Document Revised: 08/17/2011 Document Reviewed: 12/06/2010 [employer] Patient Information 2012 [employer].Dental Care and Dentist Visits Dental care supports good overall health. Regular dental visits can also help you avoid dental pain, bleeding, infection, and other more serious health problems in the future. It is important to keep the mouth healthy because diseases in the teeth, gums, and other oral tissues can spread to other areas of the body. Some problems, such as diabetes, heart disease, and pre-term labor have been associated with poor oral health.  See your dentist every 6 months. If you experience emergency problems such as a toothache or broken tooth, go to the dentist right away. If you see your dentist regularly, you may catch problems early. It is easier to be treated for problems in the early stages.  WHAT TO EXPECT AT A DENTIST VISIT  Your dentist will look for many common oral health problems and recommend proper treatment. At your regular dental visit, you can expect:  Gentle cleaning of the teeth and gums. This includes scraping and polishing. This helps to remove the sticky substance around the teeth and gums (plaque). Plaque forms in the mouth shortly after eating. Over time, plaque hardens on the teeth as tartar.   If tartar is not removed regularly, it can cause problems. Cleaning also helps remove stains.   Periodic X-rays. These pictures of the teeth and supporting bone will help your dentist assess the health of your teeth.   Periodic fluoride treatments. Fluoride is a natural mineral shown to help strengthen teeth. Fluoride treatmentinvolves applying a fluoride gel or varnish to the teeth. It is most commonly done in children.   Examination of the mouth, tongue, jaws, teeth, and gums to look for any oral health problems, such as:   Cavities (dental  caries). This is decay on the tooth caused by plaque, sugar, and acid in the mouth. It is best to catch a cavity when it is small.   Inflammation of the gums caused by plaque buildup (gingivitis).   Problems with the mouth or malformed or misaligned teeth.   Oral cancer or other diseases of the soft tissues or jaws.  KEEP YOUR TEETH AND GUMS HEALTHY For healthy teeth and gums, follow these general guidelines as well as your dentist's specific advice:  Have your teeth professionally cleaned at the dentist every 6 months.   Brush twice daily with a fluoride toothpaste.   Floss your teeth daily.   Ask your dentist if you need fluoride supplements, treatments, or fluoride toothpaste.   Eat a healthy diet. Reduce foods and drinks with added sugar.   Avoid smoking.  TREATMENT FOR ORAL HEALTH PROBLEMS If you have oral health problems, treatment varies depending on the conditions present in your teeth and gums.  Your caregiver will most likely recommend good oral hygiene at each visit.   For cavities, gingivitis, or other oral health disease, your caregiver will perform a procedure to treat the problem. This is typically done at a separate appointment. Sometimes your caregiver will refer you to another dental specialist for specific tooth problems or for surgery.  SEEK IMMEDIATE DENTAL CARE IF:  You have pain, bleeding, or soreness in the gum, tooth, jaw, or mouth area.   A permanent tooth becomes loose or separated from the gum socket.   You experience a blow or injury to the mouth or jaw area.  Document Released: 05/10/2011 Document Revised: 08/17/2011 Document Reviewed: 05/10/2011 [employer] Patient Information 2012 [employer]. 

## 2011-12-30 NOTE — ED Provider Notes (Signed)
History     CSN: [id number]  Arrival date & time 12/30/11  0248   First MD Initiated Contact with Patient 12/30/11 [phone]      Chief Complaint  Patient presents with  . Dental Pain    (Consider location/radiation/quality/duration/timing/severity/associated sxs/prior treatment) HPI Comments: Patient here with a several week history of right lower 2nd molar pain - states that tonight while he was flossing his teeth he noticed blood and pus coming from the area - states that the pain was also then radiating up into his jaw and his right ear - denies fever, chills, trismus, sore throat, difficulty swallowing.  Patient is a 36 y.o. male presenting with tooth pain. The history is provided by the patient. No language interpreter was used.  Dental PainThe primary symptoms include mouth pain and oral bleeding. Primary symptoms do not include dental injury, oral lesions, headaches, fever, shortness of breath, sore throat, angioedema or cough. The symptoms began more than 1 week ago. The symptoms are unchanged. The symptoms are new. The symptoms occur intermittently.  Additional symptoms include: gum swelling, gum tenderness, purulent gums, jaw pain and ear pain. Additional symptoms do not include: dental sensitivity to temperature, trismus, facial swelling, trouble swallowing, pain with swallowing, excessive salivation, dry mouth, taste disturbance, smell disturbance, drooling, hearing loss, nosebleeds, swollen glands, goiter and fatigue.    Past Medical History  Diagnosis Date  . Anxiety   . Asthma   . GERD (gastroesophageal reflux disease)   . Allergy     RHINITIS  . Hyperlipidemia   . Pre-diabetes   . Acid reflux   . Insomnia     History reviewed. No pertinent past surgical history.  Family History  Problem Relation Age of Onset  . Heart disease Father     CORNARY HEART DISEASE/ 2 BYPASS SURGERIES  . Cancer Father     PROSTATE  . Heart disease Paternal Grandfather     CORNARY HEART  DISEASE  . Arthritis Paternal Grandfather     History  Substance Use Topics  . Smoking status: Current Everyday Smoker    Types: Cigarettes  . Smokeless tobacco: Not on file  . Alcohol Use: No      Review of Systems  Constitutional: Negative for fever and fatigue.  HENT: Positive for ear pain. Negative for hearing loss, nosebleeds, sore throat, facial swelling, drooling and trouble swallowing.   Respiratory: Negative for cough and shortness of breath.   Neurological: Negative for headaches.  All other systems reviewed and are negative.    Allergies  Review of patient's allergies indicates no known allergies.  Home Medications   Current Outpatient Rx  Name Route Sig Dispense Refill  . ACETAMINOPHEN 500 MG PO TABS Oral Take 1,000 mg by mouth every 12 (twelve) hours as needed. As needed for pain    . ALBUTEROL SULFATE HFA 108 (90 BASE) MCG/ACT IN AERS Inhalation Inhale 2 puffs into the lungs every 6 (six) hours as needed.      Marland Kitchen DIPHENHYDRAMINE HCL 25 MG PO TABS Oral Take 25 mg by mouth every 6 (six) hours as needed. For seasonal allergy relief    . IBUPROFEN 200 MG PO TABS Oral Take 200 mg by mouth every 6 (six) hours as needed. For pain relief    . LORAZEPAM 0.5 MG PO TABS Oral Take 0.5 mg by mouth every 8 (eight) hours.      . ADULT MULTIVITAMIN W/MINERALS CH Oral Take 1 tablet by mouth daily.    Marland Kitchen OMEPRAZOLE  40 MG PO CPDR Oral Take 40 mg by mouth daily.    Marland Kitchen PSEUDOEPHEDRINE HCL 30 MG PO TABS Oral Take 30 mg by mouth every 4 (four) hours as needed. For sinus congestion relief      BP 129/81  Pulse 115  Temp(Src) 98.1 F (36.7 C) (Oral)  Resp 18  SpO2 99%  Physical Exam  Nursing note and vitals reviewed. Constitutional: He is oriented to person, place, and time. He appears well-developed and well-nourished. No distress.  HENT:  Head: Normocephalic and atraumatic.  Right Ear: External ear normal.  Left Ear: External ear normal.  Nose: Nose normal.  Mouth/Throat:  Oropharynx is clear and moist. Dental abscesses and dental caries present. No oropharyngeal exudate.    Eyes: Conjunctivae are normal. Pupils are equal, round, and reactive to light. No scleral icterus.  Neck: Normal range of motion. Neck supple.  Cardiovascular: Normal rate, regular rhythm and normal heart sounds.  Exam reveals no gallop and no friction rub.   No murmur heard. Pulmonary/Chest: Effort normal and breath sounds normal. No respiratory distress. He has no wheezes. He has no rales. He exhibits no tenderness.  Abdominal: Soft. Bowel sounds are normal. He exhibits no distension. There is no tenderness.  Musculoskeletal: Normal range of motion. He exhibits no edema and no tenderness.  Lymphadenopathy:    He has no cervical adenopathy.  Neurological: He is alert and oriented to person, place, and time. No cranial nerve deficit.  Skin: Skin is warm and dry. No rash noted. No erythema. No pallor.  Psychiatric: He has a normal mood and affect. His behavior is normal. Judgment and thought content normal.    ED Course  Procedures (including critical care time)  Labs Reviewed - No data to display No results found.   Dental abscess    MDM  Though I saw no definite dental abscess, given the patient's report of purulent drainage while flossing, I will place the patient on abx and pain control and he can follow up with his dentist.        Scarlette Calico C. [place], [place] 12/30/11 [phone]

## 2011-12-30 NOTE — ED Provider Notes (Signed)
Medical screening examination/treatment/procedure(s) were performed by non-physician practitioner and as supervising physician I was immediately available for consultation/collaboration.   Gavin Pound. Oletta Lamas, MD 12/30/11 7829

## 2012-01-15 ENCOUNTER — Emergency Department (HOSPITAL_COMMUNITY)
Admission: EM | Admit: 2012-01-15 | Discharge: 2012-01-16 | Disposition: A | Discharge status: Home / Self Care | Payer: Self-pay | Attending: Emergency Medicine | Admitting: Emergency Medicine

## 2012-01-15 ENCOUNTER — Emergency Department (HOSPITAL_COMMUNITY): Payer: Self-pay

## 2012-01-15 ENCOUNTER — Encounter (HOSPITAL_COMMUNITY): Payer: Self-pay | Admitting: Emergency Medicine

## 2012-01-15 DIAGNOSIS — J40 Bronchitis, not specified as acute or chronic: Secondary | ICD-10-CM | POA: Insufficient documentation

## 2012-01-15 DIAGNOSIS — J45909 Unspecified asthma, uncomplicated: Secondary | ICD-10-CM | POA: Insufficient documentation

## 2012-01-15 DIAGNOSIS — E785 Hyperlipidemia, unspecified: Secondary | ICD-10-CM | POA: Insufficient documentation

## 2012-01-15 DIAGNOSIS — R091 Pleurisy: Secondary | ICD-10-CM | POA: Insufficient documentation

## 2012-01-15 DIAGNOSIS — K219 Gastro-esophageal reflux disease without esophagitis: Secondary | ICD-10-CM | POA: Insufficient documentation

## 2012-01-15 NOTE — ED Notes (Signed)
PT. REPORTS PERSISTENT PRODUCTIVE COUGH WITH RIGHT ANTERIOR RIBCAGE PAIN WORSE W ITH DEEP INSPIRATION AND COUGHING FOR SEVERAL DAYS .

## 2012-01-16 LAB — POCT I-STAT, CHEM 8
Creatinine, Ser: 1.1 mg/dL (ref 0.50–1.35)
Glucose, Bld: 94 mg/dL (ref 70–99)
HCT: 43 % (ref 39.0–52.0)
Hemoglobin: 14.6 g/dL (ref 13.0–17.0)
Potassium: 4.3 mEq/L (ref 3.5–5.1)
Sodium: 139 mEq/L (ref 135–145)
TCO2: 26 mmol/L (ref 0–100)

## 2012-01-16 MED ORDER — HYDROCODONE-ACETAMINOPHEN 5-325 MG PO TABS: 1.0000 | ORAL_TABLET | ORAL | Status: DC | PRN

## 2012-01-16 MED ORDER — AZITHROMYCIN 250 MG PO TABS: 250.0000 mg | ORAL_TABLET | Freq: Every day | ORAL | Status: AC

## 2012-01-16 NOTE — ED Provider Notes (Signed)
History     CSN: [id number]  Arrival date & time 01/15/12  2116   First MD Initiated Contact with Patient 01/16/12 0019      Chief Complaint  Patient presents with  . Cough    (Consider location/radiation/quality/duration/timing/severity/associated sxs/prior treatment) HPI Comments: Patient here with a several day history of cough with green/brown sputum production and right lateral rib pain both with cough and deep inspiration - states pain was worse several days ago and is now gradually decreasing - denies fever, chills, radiation of the pain - nausea, vomiting, hemoptysis, shortness of breath.  Patient is a 36 y.o. male presenting with cough. The history is provided by the patient. No language interpreter was used.  Cough This is a new problem. The current episode started 2 days ago. The problem occurs every few hours. The problem has not changed since onset.The cough is productive of sputum. There has been no fever. Associated symptoms include chest pain. Pertinent negatives include no chills, no sweats, no weight loss, no ear congestion, no ear pain, no headaches, no rhinorrhea, no sore throat, no myalgias, no shortness of breath, no wheezing and no eye redness. He has tried nothing for the symptoms. The treatment provided no relief. He is a smoker.    Past Medical History  Diagnosis Date  . Anxiety   . Asthma   . GERD (gastroesophageal reflux disease)   . Allergy     RHINITIS  . Hyperlipidemia   . Pre-diabetes   . Acid reflux   . Insomnia     History reviewed. No pertinent past surgical history.  Family History  Problem Relation Age of Onset  . Heart disease Father     CORNARY HEART DISEASE/ 2 BYPASS SURGERIES  . Cancer Father     PROSTATE  . Heart disease Paternal Grandfather     CORNARY HEART DISEASE  . Arthritis Paternal Grandfather     History  Substance Use Topics  . Smoking status: Current Everyday Smoker    Types: Cigarettes  . Smokeless tobacco: Not on  file  . Alcohol Use: No      Review of Systems  Constitutional: Negative for chills and weight loss.  HENT: Negative for ear pain, sore throat and rhinorrhea.   Eyes: Negative for redness.  Respiratory: Positive for cough. Negative for shortness of breath and wheezing.   Cardiovascular: Positive for chest pain.  Musculoskeletal: Negative for myalgias.  Neurological: Negative for headaches.  All other systems reviewed and are negative.    Allergies  Review of patient's allergies indicates no known allergies.  Home Medications   Current Outpatient Rx  Name Route Sig Dispense Refill  . ACETAMINOPHEN 500 MG PO TABS Oral Take 1,000 mg by mouth every 12 (twelve) hours as needed. As needed for pain    . ALBUTEROL SULFATE HFA 108 (90 BASE) MCG/ACT IN AERS Inhalation Inhale 2 puffs into the lungs every 6 (six) hours as needed. For wheezing    . ALBUTEROL SULFATE (2.5 MG/3ML) 0.083% IN NEBU Nebulization Take 2.5 mg by nebulization every 6 (six) hours as needed. For wheezing    . DIPHENHYDRAMINE HCL 25 MG PO TABS Oral Take 25 mg by mouth every 6 (six) hours as needed. For seasonal allergy relief    . IBUPROFEN 200 MG PO TABS Oral Take 200 mg by mouth every 6 (six) hours as needed. For pain relief    . LORAZEPAM 0.5 MG PO TABS Oral Take 1 mg by mouth at bedtime. May take  1 additional tab during the day if needed for anxiety    . ADULT MULTIVITAMIN W/MINERALS CH Oral Take 1 tablet by mouth daily.    Marland Kitchen OMEPRAZOLE 40 MG PO CPDR Oral Take 40 mg by mouth at bedtime.     Marland Kitchen PSEUDOEPHEDRINE HCL 30 MG PO TABS Oral Take 30 mg by mouth every 4 (four) hours as needed. For sinus congestion relief      BP 119/74  Pulse 77  Temp(Src) 98.2 F (36.8 C) (Oral)  Resp 16  SpO2 97%  Physical Exam  Nursing note and vitals reviewed. Constitutional: He is oriented to person, place, and time. He appears well-developed and well-nourished. No distress.  HENT:  Head: Normocephalic and atraumatic.  Right Ear:  External ear normal.  Left Ear: External ear normal.  Nose: Nose normal.  Mouth/Throat: Oropharynx is clear and moist. No oropharyngeal exudate.  Eyes: Conjunctivae are normal. Pupils are equal, round, and reactive to light. No scleral icterus.  Neck: Normal range of motion. Neck supple.  Cardiovascular: Normal rate, regular rhythm, normal heart sounds and intact distal pulses.  Exam reveals no gallop and no friction rub.   No murmur heard. Pulmonary/Chest: Effort normal and breath sounds normal. No respiratory distress. He has no wheezes. He has no rales. He exhibits no tenderness.  Abdominal: Soft. Bowel sounds are normal. He exhibits no distension. There is no tenderness.  Musculoskeletal: Normal range of motion. He exhibits no edema and no tenderness.  Lymphadenopathy:    He has no cervical adenopathy.  Neurological: He is alert and oriented to person, place, and time. No cranial nerve deficit.  Skin: Skin is warm and dry. No rash noted. No erythema. No pallor.  Psychiatric: He has a normal mood and affect. His behavior is normal. Judgment and thought content normal.    ED Course  Procedures (including critical care time)   Labs Reviewed  D-DIMER, QUANTITATIVE  POCT I-STAT, CHEM 8   Dg Ribs Unilateral W/chest Right  01/15/2012  *RADIOLOGY REPORT*  Clinical Data: Right lower anterior rib pain and cough.  Coughing up brown phlegm.  History of smoking.  RIGHT RIBS AND CHEST - 3+ VIEW  Comparison: Chest radiograph performed 04/16/2011  Findings: No displaced rib fractures are seen.  The lungs are well-aerated and clear.  There is no evidence of focal opacification, pleural effusion or pneumothorax.  The cardiomediastinal silhouette is within normal limits.  No acute osseous abnormalities are seen.  IMPRESSION: No acute cardiopulmonary process identified.  No displaced rib fractures seen.  Original Report Authenticated By: Tonia Ghent, M.D.     Pleurisy Bronchitis   MDM  Patient  without fractures or infiltrate noted on chest x-ray - symptoms consistent with pleurisy.  Though the pain is pleuritic - d-dimer is negative - doubt ACS.        Izola Price [place], [place] 01/16/12 [phone]

## 2012-01-16 NOTE — ED Notes (Signed)
Pt. Discharged to home pt. Alert and oriented, gait steady, respirations even and regular

## 2012-01-16 NOTE — ED Notes (Signed)
Received pt. From triage, pt. Alert and oriented, ambulatory, gait steady, respirations even and regular, NAD noted 

## 2012-01-16 NOTE — ED Provider Notes (Signed)
Medical screening examination/treatment/procedure(s) were performed by non-physician practitioner and as supervising physician I was immediately available for consultation/collaboration.    Zenia Guest D Itzamar Traynor, MD 01/16/12 0523 

## 2012-01-16 NOTE — Discharge Instructions (Signed)
Bronchitis Bronchitis is a problem of the air tubes leading to your lungs. This problem makes it hard for air to get in and out of the lungs. You may cough a lot because your air tubes are narrow. Going without care can cause lasting (chronic) bronchitis. HOME CARE   Drink enough fluids to keep your pee (urine) clear or pale yellow.   Use a cool mist humidifier.   Quit smoking if you smoke. If you keep smoking, the bronchitis might not get better.   Only take medicine as told by your doctor.  GET HELP RIGHT AWAY IF:   Coughing keeps you awake.   You start to wheeze.   You become more sick or weak.   You have a hard time breathing or get short of breath.   You cough up blood.   Coughing lasts more than 2 weeks.   You have a fever.   Your baby is older than 3 months with a rectal temperature of 102 F (38.9 C) or higher.   Your baby is 3 months old or younger with a rectal temperature of 100.4 F (38 C) or higher.  MAKE SURE YOU:  Understand these instructions.   Will watch your condition.   Will get help right away if you are not doing well or get worse.  Document Released: 02/14/2008 Document Revised: 08/17/2011 Document Reviewed: 07/30/2009 Exit[employer] Patient Information 2012 [employer].Pleurisy Pleurisy is an irritation (inflammation) and puffiness (swelling) of the lining of the lungs. It is often caused by an existing infection or disease. It can be hard to breathe and hurt to breathe. Coughing or deep breathing will make it hurt more. HOME CARE  Only take medicine as told by your doctor.   Take your medicines (antibiotics) as told. Finish them even if you start to feel better.   Use a cool mist vaporizer to loosen mucus.  GET HELP RIGHT AWAY IF:   Your lips, fingernails, or toenails are blue or dark.   You cough up blood.   You have a hard time breathing.   Your pain is not controlled with medicine or it lasts for more than 1 week.   Your pain  spreads (radiates) into your neck, arms, or jaw.   You are short of breath or wheezing.   You develop a fever, rash, throw up (vomit), or faint.   Your pain gets worse.   You cough up more yellowish white (pus-like) mucus.  MAKE SURE YOU:   Understand these instructions.   Will watch your condition.   Will get help right away if you are not doing well or get worse.  Document Released: 08/10/2008 Document Revised: 08/17/2011 Document Reviewed: 11/25/2009 Exit[employer] Patient Information 2012 [employer]. 

## 2012-01-23 ENCOUNTER — Encounter (HOSPITAL_COMMUNITY): Payer: Self-pay | Admitting: *Deleted

## 2012-01-23 ENCOUNTER — Emergency Department (HOSPITAL_COMMUNITY)
Admission: EM | Admit: 2012-01-23 | Discharge: 2012-01-23 | Disposition: A | Discharge status: Home / Self Care | Payer: Self-pay | Attending: Emergency Medicine | Admitting: Emergency Medicine

## 2012-01-23 ENCOUNTER — Emergency Department (HOSPITAL_COMMUNITY): Payer: Self-pay

## 2012-01-23 DIAGNOSIS — R079 Chest pain, unspecified: Secondary | ICD-10-CM | POA: Insufficient documentation

## 2012-01-23 DIAGNOSIS — G4700 Insomnia, unspecified: Secondary | ICD-10-CM | POA: Insufficient documentation

## 2012-01-23 DIAGNOSIS — Z79899 Other long term (current) drug therapy: Secondary | ICD-10-CM | POA: Insufficient documentation

## 2012-01-23 DIAGNOSIS — K219 Gastro-esophageal reflux disease without esophagitis: Secondary | ICD-10-CM | POA: Insufficient documentation

## 2012-01-23 DIAGNOSIS — R0602 Shortness of breath: Secondary | ICD-10-CM | POA: Insufficient documentation

## 2012-01-23 DIAGNOSIS — F411 Generalized anxiety disorder: Secondary | ICD-10-CM | POA: Insufficient documentation

## 2012-01-23 DIAGNOSIS — J45909 Unspecified asthma, uncomplicated: Secondary | ICD-10-CM | POA: Insufficient documentation

## 2012-01-23 DIAGNOSIS — E785 Hyperlipidemia, unspecified: Secondary | ICD-10-CM | POA: Insufficient documentation

## 2012-01-23 LAB — CBC
HCT: 40.4 % (ref 39.0–52.0)
Hemoglobin: 14.9 g/dL (ref 13.0–17.0)
MCHC: 36.9 g/dL — ABNORMAL HIGH (ref 30.0–36.0)
MCV: 88.6 fL (ref 78.0–100.0)
RDW: 12.7 % (ref 11.5–15.5)
WBC: 8.8 10*3/uL (ref 4.0–10.5)

## 2012-01-23 LAB — POCT I-STAT, CHEM 8
BUN: 15 mg/dL (ref 6–23)
Calcium, Ion: 1.14 mmol/L (ref 1.12–1.32)
Chloride: 104 mEq/L (ref 96–112)
Glucose, Bld: 124 mg/dL — ABNORMAL HIGH (ref 70–99)
TCO2: 26 mmol/L (ref 0–100)

## 2012-01-23 LAB — DIFFERENTIAL
Basophils Absolute: 0 10*3/uL (ref 0.0–0.1)
Eosinophils Relative: 4 % (ref 0–5)
Lymphocytes Relative: 23 % (ref 12–46)
Monocytes Absolute: 0.7 10*3/uL (ref 0.1–1.0)
Monocytes Relative: 8 % (ref 3–12)

## 2012-01-23 MED ORDER — ALBUTEROL SULFATE (5 MG/ML) 0.5% IN NEBU
5.0000 mg | INHALATION_SOLUTION | Freq: Once | RESPIRATORY_TRACT | Status: AC
  Administered 2012-01-23: 5 mg via RESPIRATORY_TRACT
  Filled 2012-01-23: qty 1

## 2012-01-23 MED ORDER — KETOROLAC TROMETHAMINE 30 MG/ML IJ SOLN
60.0000 mg | Freq: Once | INTRAMUSCULAR | Status: AC
  Administered 2012-01-23: 60 mg via INTRAMUSCULAR
  Filled 2012-01-23: qty 2

## 2012-01-23 NOTE — ED Provider Notes (Signed)
I saw and evaluated the patient, reviewed the resident's note and I agree with the findings and plan.  Cheri Guppy, MD 01/23/12 [phone]

## 2012-01-23 NOTE — Discharge Instructions (Signed)
Chest Pain (Nonspecific) It is often hard to give a specific diagnosis for the cause of chest pain. There is always a chance that your pain could be related to something serious, such as a heart attack or a blood clot in the lungs. You need to follow up with your caregiver for further evaluation. CAUSES   Heartburn.   Pneumonia or bronchitis.   Anxiety or stress.   Inflammation around your heart (pericarditis) or lung (pleuritis or pleurisy).   A blood clot in the lung.   A collapsed lung (pneumothorax). It can develop suddenly on its own (spontaneous pneumothorax) or from injury (trauma) to the chest.   Shingles infection (herpes zoster virus).  The chest wall is composed of bones, muscles, and cartilage. Any of these can be the source of the pain.  The bones can be bruised by injury.   The muscles or cartilage can be strained by coughing or overwork.   The cartilage can be affected by inflammation and become sore (costochondritis).  DIAGNOSIS  Lab tests or other studies, such as X-rays, electrocardiography, stress testing, or cardiac imaging, may be needed to find the cause of your pain.  TREATMENT   Treatment depends on what may be causing your chest pain. Treatment may include:   Acid blockers for heartburn.   Anti-inflammatory medicine.   Pain medicine for inflammatory conditions.   Antibiotics if an infection is present.   You may be advised to change lifestyle habits. This includes stopping smoking and avoiding alcohol, caffeine, and chocolate.   You may be advised to keep your head raised (elevated) when sleeping. This reduces the chance of acid going backward from your stomach into your esophagus.   Most of the time, nonspecific chest pain will improve within 2 to 3 days with rest and mild pain medicine.  HOME CARE INSTRUCTIONS   If antibiotics were prescribed, take your antibiotics as directed. Finish them even if you start to feel better.   For the next few  days, avoid physical activities that bring on chest pain. Continue physical activities as directed.   Do not smoke.   Avoid drinking alcohol.   Only take over-the-counter or prescription medicine for pain, discomfort, or fever as directed by your caregiver.   Follow your caregiver's suggestions for further testing if your chest pain does not go away.   Keep any follow-up appointments you made. If you do not go to an appointment, you could develop lasting (chronic) problems with pain. If there is any problem keeping an appointment, you must call to reschedule.  SEEK MEDICAL CARE IF:   You think you are having problems from the medicine you are taking. Read your medicine instructions carefully.   Your chest pain does not go away, even after treatment.   You develop a rash with blisters on your chest.  SEEK IMMEDIATE MEDICAL CARE IF:   You have increased chest pain or pain that spreads to your arm, neck, jaw, back, or abdomen.   You develop shortness of breath, an increasing cough, or you are coughing up blood.   You have severe back or abdominal pain, feel nauseous, or vomit.   You develop severe weakness, fainting, or chills.   You have a fever.  THIS IS AN EMERGENCY. Do not wait to see if the pain will go away. Get medical help at once. Call your local emergency services (911 in [place] Do not drive yourself to the hospital. MAKE SURE YOU:   Understand these instructions.     Will watch your condition.   Will get help right away if you are not doing well or get worse.  Document Released: 06/07/2005 Document Revised: 08/17/2011 Document Reviewed: 04/02/2008 Exit[employer] Patient Information 2012 [employer]. 

## 2012-01-23 NOTE — ED Notes (Signed)
C/O shortness of breath. States that he was here 2 weeks ago and was Dx with pleurisy. States that he finished his antibiotics and that pain returned. States that he is short of breath because it hurts to breathe. Bilateral breath sounds are clear. ? Right lateral  friction rub.Marland Kitchen

## 2012-01-23 NOTE — ED Notes (Addendum)
Recent dx of pleurisy, has finished zpack, also taking albuterol and had Rx for vicodin. Instructed to return if not getting better. Here tonight because it is getting more difficult to take a deep breath and R lung is hurting more. Also reports non-productive cough. Denies fever. No relief with albuterol HFA inhaler. Guarding resps & speech d/t cough. LS CTA bilateral.

## 2012-01-23 NOTE — ED Provider Notes (Signed)
History     CSN: [id number]  Arrival date & time 01/23/12  1929   First MD Initiated Contact with Patient 01/23/12 2034      Chief Complaint  Patient presents with  . Shortness of Breath    (Consider location/radiation/quality/duration/timing/severity/associated sxs/prior treatment) HPI Comments: Seen 1 week ago and diagnosed with pleurisy after developing acute onset right lower chest pain.  Returned because has not resolved.  Patient is a 36 y.o. male presenting with shortness of breath. The history is provided by the patient.  Shortness of Breath  Episode onset: 1 week. The onset was sudden. The problem occurs continuously. The problem has been unchanged. The problem is moderate. The symptoms are relieved by nothing. Exacerbated by: deep breaths. Associated symptoms include chest pain (right lower chest) and shortness of breath (because of pain in right chest). Pertinent negatives include no fever, no stridor, no cough and no wheezing.    Past Medical History  Diagnosis Date  . Anxiety   . Asthma   . GERD (gastroesophageal reflux disease)   . Allergy     RHINITIS  . Hyperlipidemia   . Pre-diabetes   . Acid reflux   . Insomnia     History reviewed. No pertinent past surgical history.  Family History  Problem Relation Age of Onset  . Heart disease Father     CORNARY HEART DISEASE/ 2 BYPASS SURGERIES  . Cancer Father     PROSTATE  . Heart disease Paternal Grandfather     CORNARY HEART DISEASE  . Arthritis Paternal Grandfather     History  Substance Use Topics  . Smoking status: Former Smoker    Types: Cigarettes    Quit date: 01/16/2012  . Smokeless tobacco: Not on file  . Alcohol Use: No      Review of Systems  Constitutional: Negative for fever, activity change and fatigue.  HENT: Negative for congestion.   Eyes: Negative for pain.  Respiratory: Positive for shortness of breath (because of pain in right chest). Negative for cough, chest tightness,  wheezing and stridor.   Cardiovascular: Positive for chest pain (right lower chest). Negative for leg swelling.  Genitourinary: Negative for dysuria.  Musculoskeletal: Negative for arthralgias.  Skin: Negative for rash.  Neurological: Negative for headaches.  Psychiatric/Behavioral: Negative for behavioral problems.    Allergies  Review of patient's allergies indicates no known allergies.  Home Medications   Current Outpatient Rx  Name Route Sig Dispense Refill  . ALBUTEROL SULFATE HFA 108 (90 BASE) MCG/ACT IN AERS Inhalation Inhale 2 puffs into the lungs every 6 (six) hours as needed. For wheezing    . AZITHROMYCIN 250 MG PO TABS Oral Take 250-500 mg by mouth daily. For 5 days Started 5/8 2 tablets on day one 1 tablet remainder days    . HYDROCODONE-ACETAMINOPHEN 5-325 MG PO TABS Oral Take 1 tablet by mouth every 4 (four) hours as needed. For pain    . IBUPROFEN 200 MG PO TABS Oral Take 200-600 mg by mouth every 6 (six) hours as needed. For pain relief    . LORAZEPAM 0.5 MG PO TABS Oral Take 1 mg by mouth at bedtime. May take 1 additional tab during the day if needed for anxiety    . OMEPRAZOLE 40 MG PO CPDR Oral Take 40 mg by mouth at bedtime.       BP 123/78  Pulse 90  Temp 97.8 F (36.6 C)  Resp 18  SpO2 97%  Physical Exam  Constitutional: He  is oriented to person, place, and time. He appears well-developed and well-nourished. No distress.  HENT:  Head: Normocephalic and atraumatic.  Eyes: Conjunctivae and EOM are normal. Pupils are equal, round, and reactive to light. No scleral icterus.  Neck: Normal range of motion. Neck supple.  Cardiovascular: Normal rate and regular rhythm.  Exam reveals no gallop and no friction rub.   No murmur heard. Pulmonary/Chest: Effort normal and breath sounds normal. No respiratory distress. He has no wheezes. He has no rales. He exhibits no tenderness.  Abdominal: Soft. He exhibits no distension and no mass. There is no tenderness. There  is no rebound and no guarding.  Musculoskeletal: Normal range of motion. He exhibits no edema and no tenderness.  Neurological: He is alert and oriented to person, place, and time. He has normal reflexes. No cranial nerve deficit. He exhibits normal muscle tone. Coordination normal.  Skin: Skin is warm and dry. No rash noted. He is not diaphoretic. No erythema.  Psychiatric: He has a normal mood and affect. His behavior is normal. Judgment and thought content normal.    ED Course  Procedures (including critical care time)  EKG: NSR, no ST/T wave abnormality.   Labs Reviewed  CBC - Abnormal; Notable for the following:    MCHC 36.9 (*)    All other components within normal limits  POCT I-STAT, CHEM 8 - Abnormal; Notable for the following:    Glucose, Bld 124 (*)    All other components within normal limits  DIFFERENTIAL   Dg Chest 2 View  01/23/2012  *RADIOLOGY REPORT*  Clinical Data: Cough.  Chest pain.  CHEST - 2 VIEW  Comparison:  01/15/2012  Findings:  The heart size and mediastinal contours are within normal limits.  Both lungs are clear.  The visualized skeletal structures are unremarkable.  IMPRESSION: No active cardiopulmonary disease.  Original Report Authenticated By: Danae Orleans, M.D.     1. Chest pain       MDM  Seen 1 week ago and diagnosed with pleurisy after developing acute onset right lower chest pain.  Returned because has not resolved.  VSS and well appearing.  PERC negative and hx atypical for PE.  No evidence of DVT on exam.  Doubt PE.  DOubt ACS given age and atypical presentation. CXR without acute findings.  Labs unconcerning.  Likely MSK strain vs pleurisy.  Pt has 1 week PCP f/u.  Treated pain with toradol and recommended motrin as pt has not wanted to take the vicodin previously prescribed.  Pt comfortable with plan and will follow up.         Army Chaco, MD 01/23/12 [phone]

## 2012-06-13 ENCOUNTER — Encounter (HOSPITAL_COMMUNITY): Payer: Self-pay | Admitting: *Deleted

## 2012-06-13 ENCOUNTER — Emergency Department (HOSPITAL_COMMUNITY)
Admission: EM | Admit: 2012-06-13 | Discharge: 2012-06-13 | Disposition: A | Discharge status: Home / Self Care | Payer: Self-pay | Attending: Emergency Medicine | Admitting: Emergency Medicine

## 2012-06-13 DIAGNOSIS — R7309 Other abnormal glucose: Secondary | ICD-10-CM | POA: Insufficient documentation

## 2012-06-13 DIAGNOSIS — E785 Hyperlipidemia, unspecified: Secondary | ICD-10-CM | POA: Insufficient documentation

## 2012-06-13 DIAGNOSIS — F411 Generalized anxiety disorder: Secondary | ICD-10-CM | POA: Insufficient documentation

## 2012-06-13 DIAGNOSIS — K219 Gastro-esophageal reflux disease without esophagitis: Secondary | ICD-10-CM | POA: Insufficient documentation

## 2012-06-13 DIAGNOSIS — Z87891 Personal history of nicotine dependence: Secondary | ICD-10-CM | POA: Insufficient documentation

## 2012-06-13 DIAGNOSIS — G4700 Insomnia, unspecified: Secondary | ICD-10-CM | POA: Insufficient documentation

## 2012-06-13 DIAGNOSIS — J45909 Unspecified asthma, uncomplicated: Secondary | ICD-10-CM | POA: Insufficient documentation

## 2012-06-13 DIAGNOSIS — M5430 Sciatica, unspecified side: Secondary | ICD-10-CM | POA: Insufficient documentation

## 2012-06-13 MED ORDER — DIAZEPAM 5 MG PO TABS: 5.0000 mg | ORAL_TABLET | Freq: Three times a day (TID) | ORAL | Status: DC | PRN

## 2012-06-13 MED ORDER — PREDNISONE 50 MG PO TABS: 50.0000 mg | ORAL_TABLET | Freq: Every day | ORAL | Status: DC

## 2012-06-13 MED ORDER — KETOROLAC TROMETHAMINE 60 MG/2ML IM SOLN
60.0000 mg | Freq: Once | INTRAMUSCULAR | Status: AC
  Administered 2012-06-13: 60 mg via INTRAMUSCULAR
  Filled 2012-06-13: qty 2

## 2012-06-13 MED ORDER — DIAZEPAM 5 MG PO TABS
5.0000 mg | ORAL_TABLET | Freq: Once | ORAL | Status: AC
Start: 2012-06-13 — End: 2012-06-13
  Administered 2012-06-13: 5 mg via ORAL
  Filled 2012-06-13: qty 1

## 2012-06-13 MED ORDER — HYDROCODONE-ACETAMINOPHEN 5-325 MG PO TABS
1.0000 | ORAL_TABLET | Freq: Four times a day (QID) | ORAL | Status: DC | PRN
Start: 2012-06-13 — End: 2012-11-26

## 2012-06-13 NOTE — ED Notes (Signed)
PA at bedside.

## 2012-06-13 NOTE — ED Provider Notes (Signed)
Medical screening examination/treatment/procedure(s) were performed by non-physician practitioner and as supervising physician I was immediately available for consultation/collaboration.   Arron Mcnaught B. Bernette Mayers, MD 06/13/12 Jeralyn Bennett

## 2012-06-13 NOTE — ED Provider Notes (Signed)
History     CSN: [id number]  Arrival date & time 06/13/12  4098   First MD Initiated Contact with Patient 06/13/12 0407      Chief Complaint  Patient presents with  . Back Pain    (Consider location/radiation/quality/duration/timing/severity/associated sxs/prior treatment) HPI  The patient presents to the emergency department with lower back and upper back pain.  Patient, states he has tingling and numbness in his left leg and left arm at times.  Patient, states it's intermittent and the leg and arm are never numb at the same time.  Patient, states, that has been ongoing problem for the last several months.  Patient denies fever, nausea, vomiting, abdominal pain, chest pain, shortness breath, incontinence, or dysuria.  Patient, states, that his taken ibuprofen with not much relief of his symptoms.   Past Medical History  Diagnosis Date  . Anxiety   . Asthma   . GERD (gastroesophageal reflux disease)   . Allergy     RHINITIS  . Hyperlipidemia   . Pre-diabetes   . Acid reflux   . Insomnia     History reviewed. No pertinent past surgical history.  Family History  Problem Relation Age of Onset  . Heart disease Father     CORNARY HEART DISEASE/ 2 BYPASS SURGERIES  . Cancer Father     PROSTATE  . Heart disease Paternal Grandfather     CORNARY HEART DISEASE  . Arthritis Paternal Grandfather     History  Substance Use Topics  . Smoking status: Former Smoker    Types: Cigarettes    Quit date: 01/16/2012  . Smokeless tobacco: Not on file  . Alcohol Use: No      Review of Systems All other systems negative except as documented in the HPI. All pertinent positives and negatives as reviewed in the HPI.  Allergies  Review of patient's allergies indicates no known allergies.  Home Medications   Current Outpatient Rx  Name Route Sig Dispense Refill  . ALBUTEROL SULFATE HFA 108 (90 BASE) MCG/ACT IN AERS Inhalation Inhale 2 puffs into the lungs every 6 (six) hours as  needed. For wheezing    . IBUPROFEN 200 MG PO TABS Oral Take 200-600 mg by mouth every 6 (six) hours as needed. For pain relief    . OMEPRAZOLE 40 MG PO CPDR Oral Take 40 mg by mouth at bedtime.       BP 116/73  Pulse 73  Temp 97.8 F (36.6 C) (Oral)  Resp 20  SpO2 98%  Physical Exam  Nursing note and vitals reviewed. Constitutional: He appears well-developed and well-nourished.  HENT:  Head: Normocephalic and atraumatic.  Cardiovascular: Normal rate and regular rhythm.   Pulmonary/Chest: Effort normal and breath sounds normal.  Abdominal: Soft. Bowel sounds are normal. He exhibits no distension.  Musculoskeletal:       Lumbar back: He exhibits tenderness and pain. He exhibits normal range of motion, no bony tenderness, no deformity and no spasm.       Back:  Neurological: He displays normal reflexes. He exhibits normal muscle tone. Coordination normal.  Skin: Skin is warm and dry. No rash noted.    ED Course  Procedures (including critical care time)  Patient will be referred to neurosurgery as needed in followup.  Patient advised return here as needed for any worsening in his condition.  Patient has normal strength and reflexes here in the ER.  MDM          Carlyle Dolly, PA-C  06/13/12 0521 

## 2012-06-13 NOTE — ED Notes (Signed)
Pt c/o lower back and neck pain x 2 months; left leg numbness on and off; left arm numbness tonight

## 2012-06-13 NOTE — ED Notes (Addendum)
Pt c/o back pain that started 2 months prior. Pt reports stiffness in neck and denies blurred vision/difficulty urinating/difficulty with BM. Negative for n/v. 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 Pt reports spasms radiated to left arm last night. Pt states truck shakes a lot and from that blew out his back.

## 2012-08-14 ENCOUNTER — Emergency Department (HOSPITAL_COMMUNITY): Payer: Self-pay

## 2012-08-14 ENCOUNTER — Encounter (HOSPITAL_COMMUNITY): Payer: Self-pay

## 2012-08-14 ENCOUNTER — Emergency Department (HOSPITAL_COMMUNITY)
Admission: EM | Admit: 2012-08-14 | Discharge: 2012-08-14 | Disposition: A | Discharge status: Home / Self Care | Payer: Self-pay | Attending: Emergency Medicine | Admitting: Emergency Medicine

## 2012-08-14 DIAGNOSIS — R6884 Jaw pain: Secondary | ICD-10-CM | POA: Insufficient documentation

## 2012-08-14 DIAGNOSIS — E785 Hyperlipidemia, unspecified: Secondary | ICD-10-CM | POA: Insufficient documentation

## 2012-08-14 DIAGNOSIS — Z79899 Other long term (current) drug therapy: Secondary | ICD-10-CM | POA: Insufficient documentation

## 2012-08-14 DIAGNOSIS — M542 Cervicalgia: Secondary | ICD-10-CM | POA: Insufficient documentation

## 2012-08-14 DIAGNOSIS — J45909 Unspecified asthma, uncomplicated: Secondary | ICD-10-CM | POA: Insufficient documentation

## 2012-08-14 DIAGNOSIS — Z87891 Personal history of nicotine dependence: Secondary | ICD-10-CM | POA: Insufficient documentation

## 2012-08-14 DIAGNOSIS — IMO0002 Reserved for concepts with insufficient information to code with codable children: Secondary | ICD-10-CM | POA: Insufficient documentation

## 2012-08-14 DIAGNOSIS — J3489 Other specified disorders of nose and nasal sinuses: Secondary | ICD-10-CM | POA: Insufficient documentation

## 2012-08-14 DIAGNOSIS — R197 Diarrhea, unspecified: Secondary | ICD-10-CM | POA: Insufficient documentation

## 2012-08-14 DIAGNOSIS — F411 Generalized anxiety disorder: Secondary | ICD-10-CM | POA: Insufficient documentation

## 2012-08-14 DIAGNOSIS — K219 Gastro-esophageal reflux disease without esophagitis: Secondary | ICD-10-CM | POA: Insufficient documentation

## 2012-08-14 LAB — CBC WITH DIFFERENTIAL/PLATELET
Basophils Absolute: 0 10*3/uL (ref 0.0–0.1)
Basophils Relative: 0 % (ref 0–1)
Eosinophils Absolute: 0.5 10*3/uL (ref 0.0–0.7)
Eosinophils Relative: 5 % (ref 0–5)
HCT: 40.1 % (ref 39.0–52.0)
MCH: 31.9 pg (ref 26.0–34.0)
MCHC: 35.9 g/dL (ref 30.0–36.0)
MCV: 88.9 fL (ref 78.0–100.0)
Monocytes Absolute: 1 10*3/uL (ref 0.1–1.0)
Platelets: 201 10*3/uL (ref 150–400)
RDW: 12.7 % (ref 11.5–15.5)
WBC: 10.1 10*3/uL (ref 4.0–10.5)

## 2012-08-14 LAB — BASIC METABOLIC PANEL
CO2: 28 mEq/L (ref 19–32)
Calcium: 9.3 mg/dL (ref 8.4–10.5)
Creatinine, Ser: 0.99 mg/dL (ref 0.50–1.35)
GFR calc non Af Amer: >90 mL/min (ref >90)
Sodium: 137 mEq/L (ref 135–145)

## 2012-08-14 MED ORDER — LORAZEPAM 2 MG/ML IJ SOLN
0.5000 mg | Freq: Once | INTRAMUSCULAR | Status: AC
  Administered 2012-08-14: 0.5 mg via INTRAVENOUS
  Filled 2012-08-14: qty 1

## 2012-08-14 MED ORDER — MORPHINE SULFATE 4 MG/ML IJ SOLN: 4.0000 mg | Freq: Once | INTRAMUSCULAR | Status: DC

## 2012-08-14 MED ORDER — IOHEXOL 300 MG/ML  SOLN
100.0000 mL | Freq: Once | INTRAMUSCULAR | Status: AC | PRN
  Administered 2012-08-14: 100 mL via INTRAVENOUS

## 2012-08-14 MED ORDER — IBUPROFEN 600 MG PO TABS: 600.0000 mg | ORAL_TABLET | Freq: Four times a day (QID) | ORAL | Status: DC | PRN

## 2012-08-14 MED ORDER — MORPHINE SULFATE 2 MG/ML IJ SOLN
2.0000 mg | Freq: Once | INTRAMUSCULAR | Status: AC
  Administered 2012-08-14: 2 mg via INTRAVENOUS
  Filled 2012-08-14: qty 1

## 2012-08-14 MED ORDER — TRAMADOL HCL 50 MG PO TABS: 50.0000 mg | ORAL_TABLET | Freq: Four times a day (QID) | ORAL | Status: DC | PRN

## 2012-08-14 MED ORDER — SODIUM CHLORIDE 0.9 % IV BOLUS (SEPSIS)
1000.0000 mL | Freq: Once | INTRAVENOUS | Status: AC
  Administered 2012-08-14: 1000 mL via INTRAVENOUS

## 2012-08-14 MED ORDER — KETOROLAC TROMETHAMINE 30 MG/ML IJ SOLN
30.0000 mg | Freq: Once | INTRAMUSCULAR | Status: AC
  Administered 2012-08-14: 30 mg via INTRAVENOUS
  Filled 2012-08-14: qty 1

## 2012-08-14 NOTE — ED Provider Notes (Signed)
History     CSN: [id number]  Arrival date & time 08/14/12  0714   First MD Initiated Contact with Patient 08/14/12 [id number]      Chief Complaint  Patient presents with  . Jaw Pain    (Consider location/radiation/quality/duration/timing/severity/associated sxs/prior treatment) HPI Pt with R submental swelling and pain x 1 weeks. Has had previous dental abscesses requiring drainage. No fever, chills, intraoral pain, difficulty swallowing, stridor, SOB. +rhinorrhea and diarrhea.  Past Medical History  Diagnosis Date  . Anxiety   . Asthma   . GERD (gastroesophageal reflux disease)   . Allergy     RHINITIS  . Hyperlipidemia   . Pre-diabetes   . Acid reflux   . Insomnia     History reviewed. No pertinent past surgical history.  Family History  Problem Relation Age of Onset  . Heart disease Father     CORNARY HEART DISEASE/ 2 BYPASS SURGERIES  . Cancer Father     PROSTATE  . Heart disease Paternal Grandfather     CORNARY HEART DISEASE  . Arthritis Paternal Grandfather     History  Substance Use Topics  . Smoking status: Former Smoker    Types: Cigarettes    Quit date: 01/16/2012  . Smokeless tobacco: Not on file  . Alcohol Use: No      Review of Systems  Constitutional: Negative for fever and chills.  HENT: Positive for facial swelling, rhinorrhea and neck pain. Negative for sore throat, trouble swallowing, neck stiffness, dental problem and voice change.   Respiratory: Negative for chest tightness, shortness of breath, wheezing and stridor.   Cardiovascular: Negative for chest pain.  Gastrointestinal: Positive for diarrhea. Negative for nausea, vomiting and abdominal pain.  Skin: Negative for rash and wound.  Neurological: Negative for dizziness, weakness, light-headedness, numbness and headaches.    Allergies  Review of patient's allergies indicates no known allergies.  Home Medications   Current Outpatient Rx  Name  Route  Sig  Dispense  Refill  .  ALBUTEROL SULFATE HFA 108 (90 BASE) MCG/ACT IN AERS   Inhalation   Inhale 2 puffs into the lungs every 6 (six) hours as needed. For wheezing         . IBUPROFEN 200 MG PO TABS   Oral   Take 200-600 mg by mouth every 6 (six) hours as needed. For pain relief         . LORAZEPAM 0.5 MG PO TABS   Oral   Take 0.5 mg by mouth 2 (two) times daily.         Marland Kitchen OMEPRAZOLE 40 MG PO CPDR   Oral   Take 40 mg by mouth at bedtime.          Marland Kitchen DIAZEPAM 5 MG PO TABS   Oral   Take 1 tablet (5 mg total) by mouth every 8 (eight) hours as needed.   12 tablet   0   . HYDROCODONE-ACETAMINOPHEN 5-325 MG PO TABS   Oral   Take 1 tablet by mouth every 6 (six) hours as needed for pain.   15 tablet   0   . IBUPROFEN 600 MG PO TABS   Oral   Take 1 tablet (600 mg total) by mouth every 6 (six) hours as needed for pain.   30 tablet   0   . PREDNISONE 50 MG PO TABS   Oral   Take 1 tablet (50 mg total) by mouth daily.   5 tablet   0   .  TRAMADOL HCL 50 MG PO TABS   Oral   Take 1 tablet (50 mg total) by mouth every 6 (six) hours as needed for pain.   15 tablet   0     BP 128/85  Pulse 91  Temp 98.5 F (36.9 C) (Oral)  Resp 16  SpO2 100%  Physical Exam  Nursing note and vitals reviewed. Constitutional: He is oriented to person, place, and time. He appears well-developed and well-nourished. No distress.  HENT:  Head: Normocephalic and atraumatic.  Mouth/Throat: Oropharynx is clear and moist. Normal dentition. No dental abscesses, uvula swelling or dental caries. No oropharyngeal exudate, posterior oropharyngeal edema, posterior oropharyngeal erythema or tonsillar abscesses.    Eyes: EOM are normal. Pupils are equal, round, and reactive to light.  Neck: Normal range of motion. Neck supple. No rigidity.    Cardiovascular: Normal rate and regular rhythm.   Pulmonary/Chest: Effort normal and breath sounds normal. No stridor. No respiratory distress. He has no wheezes. He has no rales.   Abdominal: Soft. Bowel sounds are normal. He exhibits no distension and no mass. There is no tenderness. There is no rebound and no guarding.  Musculoskeletal: Normal range of motion. He exhibits no edema and no tenderness.  Neurological: He is alert and oriented to person, place, and time.  Skin: Skin is warm and dry. No rash noted. No erythema.  Psychiatric: He has a normal mood and affect. His behavior is normal.    ED Course  Procedures (including critical care time)   Labs Reviewed  CBC WITH DIFFERENTIAL  BASIC METABOLIC PANEL   Ct Soft Tissue Neck W Contrast  08/14/2012  *RADIOLOGY REPORT*  Clinical Data: Right neck swelling and pain.  Difficulty swallowing.  CT NECK WITH CONTRAST  Technique:  Multidetector CT imaging of the neck was performed with intravenous contrast.  Contrast: OMNIPAQUE IOHEXOL 300 MG/ML  SOLN  Comparison: None.  Findings: Lung apices are clear.  The visualized paranasal sinuses are clear.  Visualized intracranial contents are normal.  The nasopharynx and oropharynx are normal.  Para pharyngeal soft tissues are symmetric and normal.  Epiglottis and larynx is normal.  The thyroid is normal.  Submandibular and parotid glands are normal bilaterally.  No mass or adenopathy.  No soft tissue edema or abscess.  IMPRESSION: Negative   Original Report Authenticated By: Janeece Riggers, M.D.      1. Pain in lower jaw       MDM   CT normal as well as labs. Well appearing. Doubt dental related. May be cervical lymphadenopathy from URI. Encouraged to return for worsening symptoms or for any concerns.        Loren Racer, MD 08/14/12 1101

## 2012-08-14 NOTE — ED Notes (Signed)
Patient c/o right lower jaw pain, swelling, and a foul odor x 1 week.

## 2012-11-26 ENCOUNTER — Emergency Department (HOSPITAL_COMMUNITY): Payer: Self-pay

## 2012-11-26 ENCOUNTER — Encounter (HOSPITAL_COMMUNITY): Payer: Self-pay | Admitting: *Deleted

## 2012-11-26 ENCOUNTER — Emergency Department (HOSPITAL_COMMUNITY)
Admission: EM | Admit: 2012-11-26 | Discharge: 2012-11-26 | Disposition: A | Discharge status: Home / Self Care | Payer: Self-pay | Attending: Emergency Medicine | Admitting: Emergency Medicine

## 2012-11-26 DIAGNOSIS — R0781 Pleurodynia: Secondary | ICD-10-CM

## 2012-11-26 DIAGNOSIS — R05 Cough: Secondary | ICD-10-CM | POA: Insufficient documentation

## 2012-11-26 DIAGNOSIS — R5383 Other fatigue: Secondary | ICD-10-CM | POA: Insufficient documentation

## 2012-11-26 DIAGNOSIS — R51 Headache: Secondary | ICD-10-CM | POA: Insufficient documentation

## 2012-11-26 DIAGNOSIS — Z8639 Personal history of other endocrine, nutritional and metabolic disease: Secondary | ICD-10-CM | POA: Insufficient documentation

## 2012-11-26 DIAGNOSIS — J40 Bronchitis, not specified as acute or chronic: Secondary | ICD-10-CM

## 2012-11-26 DIAGNOSIS — R059 Cough, unspecified: Secondary | ICD-10-CM | POA: Insufficient documentation

## 2012-11-26 DIAGNOSIS — J209 Acute bronchitis, unspecified: Secondary | ICD-10-CM | POA: Insufficient documentation

## 2012-11-26 DIAGNOSIS — F411 Generalized anxiety disorder: Secondary | ICD-10-CM | POA: Insufficient documentation

## 2012-11-26 DIAGNOSIS — K219 Gastro-esophageal reflux disease without esophagitis: Secondary | ICD-10-CM | POA: Insufficient documentation

## 2012-11-26 DIAGNOSIS — R091 Pleurisy: Secondary | ICD-10-CM | POA: Insufficient documentation

## 2012-11-26 DIAGNOSIS — Z862 Personal history of diseases of the blood and blood-forming organs and certain disorders involving the immune mechanism: Secondary | ICD-10-CM | POA: Insufficient documentation

## 2012-11-26 DIAGNOSIS — Z87891 Personal history of nicotine dependence: Secondary | ICD-10-CM | POA: Insufficient documentation

## 2012-11-26 DIAGNOSIS — Z79899 Other long term (current) drug therapy: Secondary | ICD-10-CM | POA: Insufficient documentation

## 2012-11-26 DIAGNOSIS — R5381 Other malaise: Secondary | ICD-10-CM | POA: Insufficient documentation

## 2012-11-26 DIAGNOSIS — R42 Dizziness and giddiness: Secondary | ICD-10-CM | POA: Insufficient documentation

## 2012-11-26 DIAGNOSIS — J45909 Unspecified asthma, uncomplicated: Secondary | ICD-10-CM | POA: Insufficient documentation

## 2012-11-26 DIAGNOSIS — R0602 Shortness of breath: Secondary | ICD-10-CM | POA: Insufficient documentation

## 2012-11-26 MED ORDER — PREDNISONE 20 MG PO TABS: 40.0000 mg | ORAL_TABLET | Freq: Every day | ORAL | Status: DC

## 2012-11-26 MED ORDER — KETOROLAC TROMETHAMINE 60 MG/2ML IM SOLN
60.0000 mg | Freq: Once | INTRAMUSCULAR | Status: AC
  Administered 2012-11-26: 60 mg via INTRAMUSCULAR
  Filled 2012-11-26: qty 2

## 2012-11-26 MED ORDER — SULFAMETHOXAZOLE-TRIMETHOPRIM 800-160 MG PO TABS: 1.0000 | ORAL_TABLET | Freq: Two times a day (BID) | ORAL | Status: DC

## 2012-11-26 MED ORDER — HYDROCODONE-HOMATROPINE 5-1.5 MG/5ML PO SYRP: 5.0000 mL | ORAL_SOLUTION | Freq: Four times a day (QID) | ORAL | Status: DC | PRN

## 2012-11-26 MED ORDER — HYDROCOD POLST-CHLORPHEN POLST 10-8 MG/5ML PO LQCR
5.0000 mL | Freq: Once | ORAL | Status: AC
  Administered 2012-11-26: 5 mL via ORAL
  Filled 2012-11-26: qty 5

## 2012-11-26 NOTE — ED Provider Notes (Signed)
Medical screening examination/treatment/procedure(s) were performed by non-physician practitioner and as supervising physician I was immediately available for consultation/collaboration.   Loren Racer, MD 11/26/12 2113

## 2012-11-26 NOTE — ED Notes (Signed)
Pt c/o sharp pain "in my right lung, I think its my pleurisy is coming back." pt also c/o cough with "brown chunks."

## 2012-11-26 NOTE — ED Notes (Signed)
EKG completed in triage by Farley Ly

## 2012-11-26 NOTE — ED Notes (Signed)
Patient transported to X-ray 

## 2012-11-26 NOTE — ED Provider Notes (Signed)
History     CSN: [id number]  Arrival date & time 11/26/12  1413   First MD Initiated Contact with Patient 11/26/12 1433      Chief Complaint  Patient presents with  . Pleurisy    (Consider location/radiation/quality/duration/timing/severity/associated sxs/prior treatment) HPI Eddie Gross is a 37 y.o. male who presents to ED with complaint of pain to the right chest, cough. States started about a week ago. States short of breath. Quit smoking 4 days ago. States has been taking his albuterol, ibuprofen, with no relief. Stats hx of the same, was told he had pleurisy. Denies fever, chills, malaise. Cough is non productive. No other complaints. States "i really need something for pain, like morphine."    Past Medical History  Diagnosis Date  . Anxiety   . Asthma   . GERD (gastroesophageal reflux disease)   . Allergy     RHINITIS  . Hyperlipidemia   . Pre-diabetes   . Acid reflux   . Insomnia     History reviewed. No pertinent past surgical history.  Family History  Problem Relation Age of Onset  . Heart disease Father     CORNARY HEART DISEASE/ 2 BYPASS SURGERIES  . Cancer Father     PROSTATE  . Heart disease Paternal Grandfather     CORNARY HEART DISEASE  . Arthritis Paternal Grandfather     History  Substance Use Topics  . Smoking status: Former Smoker    Types: Cigarettes    Quit date: 01/16/2012  . Smokeless tobacco: Not on file  . Alcohol Use: No      Review of Systems  Constitutional: Negative for fever, chills and fatigue.  HENT: Negative for neck pain and neck stiffness.   Respiratory: Positive for cough, chest tightness and shortness of breath. Negative for wheezing.   Cardiovascular: Positive for chest pain. Negative for palpitations and leg swelling.  Gastrointestinal: Negative for nausea, vomiting and abdominal pain.  Genitourinary: Negative for dysuria and flank pain.  Musculoskeletal: Negative.   Skin: Negative.   Neurological: Positive for  dizziness, weakness and headaches.    Allergies  Review of patient's allergies indicates no known allergies.  Home Medications   Current Outpatient Rx  Name  Route  Sig  Dispense  Refill  . albuterol (PROVENTIL HFA;VENTOLIN HFA) 108 (90 BASE) MCG/ACT inhaler   Inhalation   Inhale 2 puffs into the lungs every 6 (six) hours as needed. For wheezing         . ibuprofen (ADVIL,MOTRIN) 200 MG tablet   Oral   Take 200-600 mg by mouth every 6 (six) hours as needed. For pain relief         . LORazepam (ATIVAN) 1 MG tablet   Oral   Take 1 mg by mouth at bedtime.         . ranitidine (ZANTAC) 150 MG tablet   Oral   Take 150 mg by mouth every evening.           BP 131/78  Pulse 82  Temp(Src) 98.1 F (36.7 C) (Oral)  Resp 16  SpO2 96%  Physical Exam  Nursing note and vitals reviewed. Constitutional: He is oriented to person, place, and time. He appears well-developed and well-nourished. No distress.  Eyes: Conjunctivae are normal.  Neck: Neck supple.  Cardiovascular: Normal rate, regular rhythm and normal heart sounds.   Pulmonary/Chest: Effort normal and breath sounds normal. No respiratory distress. He has no wheezes. He has no rales. He exhibits no tenderness.  Abdominal: Soft. Bowel sounds are normal. He exhibits no distension. There is no tenderness. There is no rebound.  Musculoskeletal: He exhibits no edema.  No calf swelling or tenderness bilaterally. Negative homans sign bilaterally  Neurological: He is alert and oriented to person, place, and time.  Skin: Skin is warm.  Psychiatric: He has a normal mood and affect. His behavior is normal.    ED Course  Procedures (including critical care time)  Labs Reviewed - No data to display Dg Chest 2 View  11/26/2012  *RADIOLOGY REPORT*  Clinical Data: Pleurisy/chest pain; cough  CHEST - 2 VIEW  Comparison: Jan 23, 2012  Findings: Lungs clear.  Heart size and pulmonary vascularity are normal.  No adenopathy.  No  pneumothorax.  No bone lesions.  IMPRESSION: No abnormality noted.   Original Report Authenticated By: Bretta Bang, M.D.      1. Bronchitis   2. Pleuritic chest pain       MDM  Pt with right sided pleuritic chest pain, cough. Pt is a smoker, quit 4 days ago. Pt is afebrile. Non toxic. VS normal. PERC negative. Doubt cardiac, atypical, pt has no risk factors. CXR negative. Will treat with NSAIDs, pain medications, prednisone for bronchitis, continue albuterol. Follow up.    Filed Vitals:   11/26/12 1420  BP: 131/78  Pulse: 82  Temp: 98.1 F (36.7 C)  TempSrc: Oral  Resp: 16  SpO2: 96%          Lottie Mussel, PA-C 11/26/12 2108

## 2013-01-12 ENCOUNTER — Encounter (HOSPITAL_COMMUNITY): Payer: Self-pay

## 2013-01-12 ENCOUNTER — Emergency Department (HOSPITAL_COMMUNITY)
Admission: EM | Admit: 2013-01-12 | Discharge: 2013-01-12 | Disposition: A | Discharge status: Home / Self Care | Payer: Self-pay | Attending: Emergency Medicine | Admitting: Emergency Medicine

## 2013-01-12 DIAGNOSIS — R110 Nausea: Secondary | ICD-10-CM | POA: Insufficient documentation

## 2013-01-12 DIAGNOSIS — Z87891 Personal history of nicotine dependence: Secondary | ICD-10-CM | POA: Insufficient documentation

## 2013-01-12 DIAGNOSIS — M79605 Pain in left leg: Secondary | ICD-10-CM

## 2013-01-12 DIAGNOSIS — R209 Unspecified disturbances of skin sensation: Secondary | ICD-10-CM | POA: Insufficient documentation

## 2013-01-12 DIAGNOSIS — J45909 Unspecified asthma, uncomplicated: Secondary | ICD-10-CM | POA: Insufficient documentation

## 2013-01-12 DIAGNOSIS — K219 Gastro-esophageal reflux disease without esophagitis: Secondary | ICD-10-CM | POA: Insufficient documentation

## 2013-01-12 DIAGNOSIS — E785 Hyperlipidemia, unspecified: Secondary | ICD-10-CM | POA: Insufficient documentation

## 2013-01-12 DIAGNOSIS — F411 Generalized anxiety disorder: Secondary | ICD-10-CM | POA: Insufficient documentation

## 2013-01-12 DIAGNOSIS — M7989 Other specified soft tissue disorders: Secondary | ICD-10-CM | POA: Insufficient documentation

## 2013-01-12 DIAGNOSIS — M79609 Pain in unspecified limb: Secondary | ICD-10-CM | POA: Insufficient documentation

## 2013-01-12 LAB — URINALYSIS, ROUTINE W REFLEX MICROSCOPIC
Glucose, UA: NEGATIVE mg/dL
Ketones, ur: NEGATIVE mg/dL
Leukocytes, UA: NEGATIVE
Protein, ur: NEGATIVE mg/dL
Urobilinogen, UA: 0.2 mg/dL (ref 0.0–1.0)

## 2013-01-12 LAB — POCT I-STAT, CHEM 8
BUN: 10 mg/dL (ref 6–23)
Chloride: 104 mEq/L (ref 96–112)
Creatinine, Ser: 1.1 mg/dL (ref 0.50–1.35)
Hemoglobin: 14.6 g/dL (ref 13.0–17.0)
Potassium: 4.1 mEq/L (ref 3.5–5.1)
Sodium: 140 mEq/L (ref 135–145)

## 2013-01-12 LAB — URINE MICROSCOPIC-ADD ON

## 2013-01-12 MED ORDER — GI COCKTAIL ~~LOC~~
30.0000 mL | Freq: Once | ORAL | Status: AC
  Administered 2013-01-12: 30 mL via ORAL
  Filled 2013-01-12: qty 30

## 2013-01-12 MED ORDER — ONDANSETRON 4 MG PO TBDP
8.0000 mg | ORAL_TABLET | Freq: Once | ORAL | Status: AC
  Administered 2013-01-12: 8 mg via ORAL
  Filled 2013-01-12: qty 2

## 2013-01-12 MED ORDER — TRAMADOL HCL 50 MG PO TABS: 50.0000 mg | ORAL_TABLET | Freq: Four times a day (QID) | ORAL | Status: DC | PRN

## 2013-01-12 MED ORDER — HYDROMORPHONE HCL PF 1 MG/ML IJ SOLN
1.0000 mg | Freq: Once | INTRAMUSCULAR | Status: AC
  Administered 2013-01-12: 1 mg via INTRAMUSCULAR
  Filled 2013-01-12: qty 1

## 2013-01-12 NOTE — ED Notes (Signed)
Patient presents with c/o left leg and foot numbness "from hip to bottom of foot." Also has pain to posterior leg, back of knee and calf. Symptoms intermittent for the "past couple of days" and is worse with driving. 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 Denies any CP or SOB. Endorses some nausea. No vomiting.  No hx DVT or PE. Has been taking ASA 81 mg BID for the past "3-4 days"

## 2013-01-12 NOTE — ED Provider Notes (Signed)
History     CSN: [id number]  Arrival date & time 01/12/13  1939   First MD Initiated Contact with Patient 01/12/13 2010      Chief Complaint  Patient presents with  . Numbness    (Consider location/radiation/quality/duration/timing/severity/associated sxs/prior treatment) HPI Comments: Patient presents with pain and swelling to his left leg. He states it started as a cramping pain in the posterior left thigh and radiates down behind his knee into his lower leg. He denies any weakness in the leg. He has some intermittent numbness in his posterior thigh. He denies any back pain. He has some mild nausea but no vomiting. Denies a fevers or chills. He denies any urinary symptoms. He denies a chest pain or shortness of breath. He does have some increase in his gastroesophageal reflux symptoms with increased burning in his throat and acid taste in his mouth. He denies a history of DVT in the past. He does work as a Agricultural consultant.   Past Medical History  Diagnosis Date  . Anxiety   . Asthma   . GERD (gastroesophageal reflux disease)   . Allergy     RHINITIS  . Hyperlipidemia   . Pre-diabetes   . Acid reflux   . Insomnia     History reviewed. No pertinent past surgical history.  Family History  Problem Relation Age of Onset  . Heart disease Father     CORNARY HEART DISEASE/ 2 BYPASS SURGERIES  . Cancer Father     PROSTATE  . Heart disease Paternal Grandfather     CORNARY HEART DISEASE  . Arthritis Paternal Grandfather     History  Substance Use Topics  . Smoking status: Former Smoker    Types: Cigarettes    Quit date: 12/29/2012  . Smokeless tobacco: Former Neurosurgeon    Quit date: 12/29/2012  . Alcohol Use: No      Review of Systems  Constitutional: Negative for fever, chills, diaphoresis and fatigue.  HENT: Negative for congestion, rhinorrhea and sneezing.   Eyes: Negative.   Respiratory: Negative for cough, chest tightness and shortness of breath.    Cardiovascular: Negative for chest pain and leg swelling.  Gastrointestinal: Negative for nausea, vomiting, abdominal pain, diarrhea and blood in stool.  Genitourinary: Negative for frequency, hematuria, flank pain and difficulty urinating.  Musculoskeletal: Positive for arthralgias. Negative for back pain.  Skin: Negative for rash.  Neurological: Positive for numbness. Negative for dizziness, speech difficulty, weakness and headaches.    Allergies  Review of patient's allergies indicates no known allergies.  Home Medications   Current Outpatient Rx  Name  Route  Sig  Dispense  Refill  . albuterol (PROVENTIL HFA;VENTOLIN HFA) 108 (90 BASE) MCG/ACT inhaler   Inhalation   Inhale 2 puffs into the lungs every 6 (six) hours as needed. For wheezing         . ibuprofen (ADVIL,MOTRIN) 200 MG tablet   Oral   Take 200-600 mg by mouth every 6 (six) hours as needed. For pain relief         . LORazepam (ATIVAN) 1 MG tablet   Oral   Take 1 mg by mouth at bedtime.         Marland Kitchen omeprazole (PRILOSEC OTC) 20 MG tablet   Oral   Take 20 mg by mouth daily as needed (heartburn).         . predniSONE (DELTASONE) 20 MG tablet   Oral   Take 2 tablets (40 mg total) by mouth daily.  10 tablet   0   . ranitidine (ZANTAC) 150 MG tablet   Oral   Take 150 mg by mouth daily as needed for heartburn.          . traMADol (ULTRAM) 50 MG tablet   Oral   Take 1 tablet (50 mg total) by mouth every 6 (six) hours as needed for pain.   15 tablet   0     BP 113/72  Pulse 96  Temp(Src) 98.7 F (37.1 C) (Oral)  Resp 14  SpO2 96%  Physical Exam  Constitutional: He is oriented to person, place, and time. He appears well-developed and well-nourished.  HENT:  Head: Normocephalic and atraumatic.  Eyes: Pupils are equal, round, and reactive to light.  Neck: Normal range of motion. Neck supple.  Cardiovascular: Normal rate, regular rhythm and normal heart sounds.   Pulmonary/Chest: Effort normal  and breath sounds normal. No respiratory distress. He has no wheezes. He has no rales. He exhibits no tenderness.  Abdominal: Soft. Bowel sounds are normal. There is no tenderness. There is no rebound and no guarding.  Musculoskeletal: Normal range of motion. He exhibits no edema.  The legs are symmetric bilaterally. There is no edema noted. Patient has good pedal pulses bilaterally. He has normal sensation in the feet bilaterally. He has some tenderness to palpation in the posterior aspect of the left knee. There is no pain on range of motion of the hip or the knee. There is no pain along the lumbosacral spine. There is no pain with sciatica  Lymphadenopathy:    He has no cervical adenopathy.  Neurological: He is alert and oriented to person, place, and time. He has normal strength. No sensory deficit. GCS eye subscore is 4. GCS verbal subscore is 5. GCS motor subscore is 6.  Skin: Skin is warm and dry. No rash noted.  Psychiatric: He has a normal mood and affect.    ED Course  Procedures (including critical care time)  Results for orders placed during the hospital encounter of 01/12/13  URINALYSIS, ROUTINE W REFLEX MICROSCOPIC      Result Value Range   Color, Urine YELLOW  YELLOW   APPearance CLEAR  CLEAR   Specific Gravity, Urine 1.026  1.005 - 1.030   pH 5.0  5.0 - 8.0   Glucose, UA NEGATIVE  NEGATIVE mg/dL   Hgb urine dipstick TRACE (*) NEGATIVE   Bilirubin Urine NEGATIVE  NEGATIVE   Ketones, ur NEGATIVE  NEGATIVE mg/dL   Protein, ur NEGATIVE  NEGATIVE mg/dL   Urobilinogen, UA 0.2  0.0 - 1.0 mg/dL   Nitrite NEGATIVE  NEGATIVE   Leukocytes, UA NEGATIVE  NEGATIVE  D-DIMER, QUANTITATIVE      Result Value Range   D-Dimer, Quant <0.27  0.00 - 0.48 ug/mL-FEU  URINE MICROSCOPIC-ADD ON      Result Value Range   Squamous Epithelial / LPF RARE  RARE   RBC / HPF 0-2  <3 RBC/hpf   Bacteria, UA RARE  RARE   Urine-Other MUCOUS PRESENT    POCT I-STAT, CHEM 8      Result Value Range    Sodium 140  135 - 145 mEq/L   Potassium 4.1  3.5 - 5.1 mEq/L   Chloride 104  96 - 112 mEq/L   BUN 10  6 - 23 mg/dL   Creatinine, Ser 1.47  0.50 - 1.35 mg/dL   Glucose, Bld 829 (*) 70 - 99 mg/dL   Calcium, Ion 5.62  1.12 -  1.23 mmol/L   TCO2 26  0 - 100 mmol/L   Hemoglobin 14.6  13.0 - 17.0 g/dL   HCT 96.0  45.4 - 09.8 %   No results found.   1. Leg pain, left       MDM  Patient pain to his left leg. It seems to start in his buttocks and radiates down his leg. I feel it could be musculoskeletal however he does have risk factors for DVT so I will bring him back tomorrow to do a Doppler ultrasound to rule out DVT. He's d-dimer was negative to this point I did not give him Lovenox. If this is negative I advised him to followup with her primary care physician if his symptoms continue. Advised to return here as needed for any worsening symptoms. Given the ED resource guide for potential followup. He was given Ultram for pain.        Rolan Bucco, MD 01/12/13 2206

## 2013-01-12 NOTE — ED Notes (Signed)
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  States this has been happening for the past 3-4 days.  States the pain starts in his left hip and travels down to the bottom of his foot which feels tingly at times.  Pulses palpable and marked.  No redness, swelling or warmth to the left leg. Denies any SOB, CP

## 2013-01-13 ENCOUNTER — Ambulatory Visit (HOSPITAL_COMMUNITY)
Admission: RE | Admit: 2013-01-13 | Discharge: 2013-01-13 | Disposition: A | Discharge status: Home / Self Care | Payer: Self-pay | Source: Ambulatory Visit

## 2013-01-13 DIAGNOSIS — M79609 Pain in unspecified limb: Secondary | ICD-10-CM

## 2013-01-20 ENCOUNTER — Encounter (HOSPITAL_COMMUNITY): Payer: Self-pay | Admitting: Emergency Medicine

## 2013-01-20 ENCOUNTER — Emergency Department (HOSPITAL_COMMUNITY)
Admission: EM | Admit: 2013-01-20 | Discharge: 2013-01-21 | Disposition: A | Discharge status: Home / Self Care | Payer: Self-pay | Attending: Emergency Medicine | Admitting: Emergency Medicine

## 2013-01-20 ENCOUNTER — Emergency Department (HOSPITAL_COMMUNITY): Payer: Self-pay

## 2013-01-20 DIAGNOSIS — R1032 Left lower quadrant pain: Secondary | ICD-10-CM | POA: Insufficient documentation

## 2013-01-20 DIAGNOSIS — F411 Generalized anxiety disorder: Secondary | ICD-10-CM | POA: Insufficient documentation

## 2013-01-20 DIAGNOSIS — K219 Gastro-esophageal reflux disease without esophagitis: Secondary | ICD-10-CM | POA: Insufficient documentation

## 2013-01-20 DIAGNOSIS — J45909 Unspecified asthma, uncomplicated: Secondary | ICD-10-CM | POA: Insufficient documentation

## 2013-01-20 DIAGNOSIS — R197 Diarrhea, unspecified: Secondary | ICD-10-CM | POA: Insufficient documentation

## 2013-01-20 DIAGNOSIS — R109 Unspecified abdominal pain: Secondary | ICD-10-CM

## 2013-01-20 DIAGNOSIS — E785 Hyperlipidemia, unspecified: Secondary | ICD-10-CM | POA: Insufficient documentation

## 2013-01-20 DIAGNOSIS — Z87891 Personal history of nicotine dependence: Secondary | ICD-10-CM | POA: Insufficient documentation

## 2013-01-20 DIAGNOSIS — Z8709 Personal history of other diseases of the respiratory system: Secondary | ICD-10-CM | POA: Insufficient documentation

## 2013-01-20 DIAGNOSIS — Z79899 Other long term (current) drug therapy: Secondary | ICD-10-CM | POA: Insufficient documentation

## 2013-01-20 LAB — COMPREHENSIVE METABOLIC PANEL
ALT: 33 U/L (ref 0–53)
AST: 22 U/L (ref 0–37)
Albumin: 3.9 g/dL (ref 3.5–5.2)
Alkaline Phosphatase: 78 U/L (ref 39–117)
GFR calc Af Amer: >90 mL/min (ref >90)
Glucose, Bld: 111 mg/dL — ABNORMAL HIGH (ref 70–99)
Potassium: 3.6 mEq/L (ref 3.5–5.1)
Sodium: 137 mEq/L (ref 135–145)
Total Protein: 7.2 g/dL (ref 6.0–8.3)

## 2013-01-20 LAB — CBC WITH DIFFERENTIAL/PLATELET
Eosinophils Absolute: 0.6 10*3/uL (ref 0.0–0.7)
Lymphs Abs: 2.7 10*3/uL (ref 0.7–4.0)
MCH: 33.1 pg (ref 26.0–34.0)
Neutrophils Relative %: 63 % (ref 43–77)
Platelets: 229 10*3/uL (ref 150–400)
RBC: 4.72 MIL/uL (ref 4.22–5.81)
WBC: 10.6 10*3/uL — ABNORMAL HIGH (ref 4.0–10.5)

## 2013-01-20 MED ORDER — SODIUM CHLORIDE 0.9 % IV SOLN
INTRAVENOUS | Status: DC
  Administered 2013-01-20: via INTRAVENOUS

## 2013-01-20 MED ORDER — FENTANYL CITRATE 0.05 MG/ML IJ SOLN
50.0000 ug | Freq: Once | INTRAMUSCULAR | Status: AC
  Administered 2013-01-20: 50 ug via INTRAVENOUS
  Filled 2013-01-20: qty 2

## 2013-01-20 MED ORDER — IOHEXOL 300 MG/ML  SOLN
50.0000 mL | Freq: Once | INTRAMUSCULAR | Status: AC | PRN
  Administered 2013-01-20: 50 mL via ORAL

## 2013-01-20 MED ORDER — ONDANSETRON HCL 4 MG/2ML IJ SOLN
4.0000 mg | Freq: Once | INTRAMUSCULAR | Status: AC
  Administered 2013-01-20: 4 mg via INTRAVENOUS
  Filled 2013-01-20: qty 2

## 2013-01-20 NOTE — ED Provider Notes (Signed)
History     CSN: [id number]  Arrival date & time 01/20/13  2216   First MD Initiated Contact with Patient 01/20/13 2311      Chief Complaint  Patient presents with  . Abdominal Pain    (Consider location/radiation/quality/duration/timing/severity/associated sxs/prior treatment) HPI This is a 37 year old male who has had about a three-day history of diarrhea which has been explosive at times. It has not been bloody. He states he is soiled his pants in his shoes as a result. He attributes this to eating a tainted gyro in [place]. He has had nausea with this but no vomiting. He has had a decreased appetite. He is here this evening because of worsening left lower quadrant pain which she rates about a 6/10. It is worse with movement or palpation. He has been taking Imodium and Pepto-Bismol without relief; the Pepto-Bismol has turned his stools black.  The patient adds that he has "been unable to digest raw vegetables" for the last month. The raw vegetables cause explosive diarrhea. He previously had no difficulty eating raw vegetables.  Past Medical History  Diagnosis Date  . Anxiety   . Asthma   . GERD (gastroesophageal reflux disease)   . Allergy     RHINITIS  . Hyperlipidemia   . Pre-diabetes   . Acid reflux   . Insomnia     History reviewed. No pertinent past surgical history.  Family History  Problem Relation Age of Onset  . Heart disease Father     CORNARY HEART DISEASE/ 2 BYPASS SURGERIES  . Cancer Father     PROSTATE  . Heart disease Paternal Grandfather     CORNARY HEART DISEASE  . Arthritis Paternal Grandfather     History  Substance Use Topics  . Smoking status: Former Smoker    Types: Cigarettes    Quit date: 12/29/2012  . Smokeless tobacco: Former Neurosurgeon    Quit date: 12/29/2012  . Alcohol Use: No      Review of Systems  All other systems reviewed and are negative.    Allergies  Review of patient's allergies indicates no known  allergies.  Home Medications   Current Outpatient Rx  Name  Route  Sig  Dispense  Refill  . albuterol (PROVENTIL HFA;VENTOLIN HFA) 108 (90 BASE) MCG/ACT inhaler   Inhalation   Inhale 2 puffs into the lungs every 6 (six) hours as needed. For wheezing         . ibuprofen (ADVIL,MOTRIN) 200 MG tablet   Oral   Take 200-600 mg by mouth every 6 (six) hours as needed. For pain relief         . LORazepam (ATIVAN) 1 MG tablet   Oral   Take 1 mg by mouth at bedtime.         Marland Kitchen omeprazole (PRILOSEC OTC) 20 MG tablet   Oral   Take 20 mg by mouth daily as needed (heartburn).         . predniSONE (DELTASONE) 20 MG tablet   Oral   Take 2 tablets (40 mg total) by mouth daily.   10 tablet   0   . ranitidine (ZANTAC) 150 MG tablet   Oral   Take 150 mg by mouth daily as needed for heartburn.          . traMADol (ULTRAM) 50 MG tablet   Oral   Take 1 tablet (50 mg total) by mouth every 6 (six) hours as needed for pain.   15 tablet  0     BP 139/89  Pulse 93  Temp(Src) 99 F (37.2 C) (Oral)  Resp 18  Wt 220 lb (99.791 kg)  BMI 30.7 kg/m2  SpO2 100%  Physical Exam General: Well-developed, well-nourished male in no acute distress; appearance consistent with age of record HENT: normocephalic, atraumatic Eyes: pupils equal round and reactive to light; extraocular muscles intact Neck: supple Heart: regular rate and rhythm Lungs: clear to auscultation bilaterally Abdomen: soft; nondistended; left lower quadrant tenderness; no masses or hepatosplenomegaly; bowel sounds decreased Extremities: No deformity; full range of motion; pulses normal Neurologic: Awake, alert and oriented; motor function intact in all extremities and symmetric; no facial droop Skin: Warm and dry Psychiatric: Flat affect    ED Course  Procedures (including critical care time)     MDM   Nursing notes and vitals signs, including pulse oximetry, reviewed.  Summary of this visit's results,  reviewed by myself:  Labs:  Results for orders placed during the hospital encounter of 01/20/13 (from the past 24 hour(s))  CBC WITH DIFFERENTIAL     Status: Abnormal   Collection Time    01/20/13 10:48 PM      Result Value Range   WBC 10.6 (*) 4.0 - 10.5 K/uL   RBC 4.72  4.22 - 5.81 MIL/uL   Hemoglobin 15.6  13.0 - 17.0 g/dL   HCT 16.1  09.6 - 04.5 %   MCV 90.5  78.0 - 100.0 fL   MCH 33.1  26.0 - 34.0 pg   MCHC 36.5 (*) 30.0 - 36.0 g/dL   RDW 40.9  81.1 - 91.4 %   Platelets 229  150 - 400 K/uL   Neutrophils Relative 63  43 - 77 %   Neutro Abs 6.7  1.7 - 7.7 K/uL   Lymphocytes Relative 25  12 - 46 %   Lymphs Abs 2.7  0.7 - 4.0 K/uL   Monocytes Relative 6  3 - 12 %   Monocytes Absolute 0.7  0.1 - 1.0 K/uL   Eosinophils Relative 5  0 - 5 %   Eosinophils Absolute 0.6  0.0 - 0.7 K/uL   Basophils Relative 1  0 - 1 %   Basophils Absolute 0.1  0.0 - 0.1 K/uL  COMPREHENSIVE METABOLIC PANEL     Status: Abnormal   Collection Time    01/20/13 10:48 PM      Result Value Range   Sodium 137  135 - 145 mEq/L   Potassium 3.6  3.5 - 5.1 mEq/L   Chloride 102  96 - 112 mEq/L   CO2 25  19 - 32 mEq/L   Glucose, Bld 111 (*) 70 - 99 mg/dL   BUN 11  6 - 23 mg/dL   Creatinine, Ser 7.82  0.50 - 1.35 mg/dL   Calcium 9.3  8.4 - 95.6 mg/dL   Total Protein 7.2  6.0 - 8.3 g/dL   Albumin 3.9  3.5 - 5.2 g/dL   AST 22  0 - 37 U/L   ALT 33  0 - 53 U/L   Alkaline Phosphatase 78  39 - 117 U/L   Total Bilirubin 0.3  0.3 - 1.2 mg/dL   GFR calc non Af Amer 90 (*) >90 mL/min   GFR calc Af Amer >90  >90 mL/min  LIPASE, BLOOD     Status: None   Collection Time    01/20/13 10:48 PM      Result Value Range   Lipase 26  11 -  59 U/L  URINALYSIS, ROUTINE W REFLEX MICROSCOPIC     Status: None   Collection Time    01/20/13 11:42 PM      Result Value Range   Color, Urine YELLOW  YELLOW   APPearance CLEAR  CLEAR   Specific Gravity, Urine 1.025  1.005 - 1.030   pH 6.0  5.0 - 8.0   Glucose, UA NEGATIVE   NEGATIVE mg/dL   Hgb urine dipstick NEGATIVE  NEGATIVE   Bilirubin Urine NEGATIVE  NEGATIVE   Ketones, ur NEGATIVE  NEGATIVE mg/dL   Protein, ur NEGATIVE  NEGATIVE mg/dL   Urobilinogen, UA 0.2  0.0 - 1.0 mg/dL   Nitrite NEGATIVE  NEGATIVE   Leukocytes, UA NEGATIVE  NEGATIVE    Imaging Studies: Ct Abdomen Pelvis W Contrast  01/21/2013  *RADIOLOGY REPORT*  Clinical Data: Left lower quadrant pain  CT ABDOMEN AND PELVIS WITH CONTRAST  Technique:  Multidetector CT imaging of the abdomen and pelvis was performed following the standard protocol during bolus administration of intravenous contrast.  Contrast: 50mL OMNIPAQUE IOHEXOL 300 MG/ML  SOLN, OMNIPAQUE IOHEXOL 300 MG/ML  SOLN  Comparison: 01/28/2010 radiographs  Findings: Mild linear opacities within the bases, favored to reflect atelectasis and/or scarring.  Heart size within normal limits.  No pleural or pericardial effusion.  Diffuse low attenuation of the liver is nonspecific post contrast however favored to reflect fatty infiltration.  Unremarkable biliary system, spleen, pancreas, adrenal glands.  Symmetric renal enhancement.  Bifid left renal pelvis.  No hydronephrosis or hydroureter.  No CT evidence for colitis.  Normal appendix.  Small bowel loops are normal course and caliber.  No free intraperitoneal air or fluid.  No lymphadenopathy.  Partially decompressed bladder.  Normal caliber aorta and branch vessels.  No acute osseous finding.  IMPRESSION: No acute abdominopelvic process identified by CT.  Hepatic steatosis.   Original Report Authenticated By: Jearld Lesch, M.D.    12:45 AM Patient advised of lab and CT findings. He was advised that no specific diagnosis was found. His symptoms may be related to food poisoning or a gastrointestinal virus. He has primary care physician in [facility] with whom he will follow up.         Hanley Seamen, MD 01/21/13 [phone]

## 2013-01-20 NOTE — ED Notes (Signed)
Patient reports that for the last 3 days has had N/V/D - also has left lower abdominal pain that he feels is from Food.

## 2013-01-21 LAB — URINALYSIS, ROUTINE W REFLEX MICROSCOPIC
Bilirubin Urine: NEGATIVE
Glucose, UA: NEGATIVE mg/dL
Hgb urine dipstick: NEGATIVE
Specific Gravity, Urine: 1.025 (ref 1.005–1.030)
pH: 6 (ref 5.0–8.0)

## 2013-01-21 MED ORDER — IOHEXOL 300 MG/ML  SOLN
100.0000 mL | Freq: Once | INTRAMUSCULAR | Status: AC | PRN
  Administered 2013-01-21: 100 mL via INTRAVENOUS

## 2013-02-05 ENCOUNTER — Encounter (HOSPITAL_COMMUNITY): Payer: Self-pay | Admitting: *Deleted

## 2013-02-05 ENCOUNTER — Emergency Department (HOSPITAL_COMMUNITY)
Admission: EM | Admit: 2013-02-05 | Discharge: 2013-02-05 | Disposition: A | Discharge status: Home / Self Care | Payer: Self-pay | Attending: Emergency Medicine | Admitting: Emergency Medicine

## 2013-02-05 DIAGNOSIS — K219 Gastro-esophageal reflux disease without esophagitis: Secondary | ICD-10-CM | POA: Insufficient documentation

## 2013-02-05 DIAGNOSIS — Z8669 Personal history of other diseases of the nervous system and sense organs: Secondary | ICD-10-CM | POA: Insufficient documentation

## 2013-02-05 DIAGNOSIS — Z79899 Other long term (current) drug therapy: Secondary | ICD-10-CM | POA: Insufficient documentation

## 2013-02-05 DIAGNOSIS — Z87891 Personal history of nicotine dependence: Secondary | ICD-10-CM | POA: Insufficient documentation

## 2013-02-05 DIAGNOSIS — Z862 Personal history of diseases of the blood and blood-forming organs and certain disorders involving the immune mechanism: Secondary | ICD-10-CM | POA: Insufficient documentation

## 2013-02-05 DIAGNOSIS — R51 Headache: Secondary | ICD-10-CM | POA: Insufficient documentation

## 2013-02-05 DIAGNOSIS — Z8639 Personal history of other endocrine, nutritional and metabolic disease: Secondary | ICD-10-CM | POA: Insufficient documentation

## 2013-02-05 DIAGNOSIS — F411 Generalized anxiety disorder: Secondary | ICD-10-CM | POA: Insufficient documentation

## 2013-02-05 DIAGNOSIS — J45909 Unspecified asthma, uncomplicated: Secondary | ICD-10-CM | POA: Insufficient documentation

## 2013-02-05 DIAGNOSIS — J029 Acute pharyngitis, unspecified: Secondary | ICD-10-CM

## 2013-02-05 DIAGNOSIS — R6883 Chills (without fever): Secondary | ICD-10-CM | POA: Insufficient documentation

## 2013-02-05 MED ORDER — HYDROCODONE-ACETAMINOPHEN 5-325 MG PO TABS
1.0000 | ORAL_TABLET | Freq: Once | ORAL | Status: AC
  Administered 2013-02-05: 1 via ORAL
  Filled 2013-02-05: qty 1

## 2013-02-05 NOTE — ED Notes (Signed)
Pt c/o 3 days of sore throat; headache; chills

## 2013-02-05 NOTE — ED Provider Notes (Signed)
History     CSN: [id number]  Arrival date & time 02/05/13  0603   First MD Initiated Contact with Patient 02/05/13 [phone]      Chief Complaint  Patient presents with  . Sore Throat    The history is provided by the patient.   patient for sore throat over the past 3 days.  His had chills and headache.  No myalgias.  No nausea vomiting or diarrhea.  No difficulty breathing or swallowing.  Shortness of breath or cough.  There were new rash.  Symptoms are mild in severity.  His pain is worsened by swallowing and by drinking fluids.  He is able to keep things down.  Nothing improves his pain.  He tried over-the-counter pain medications.  Past Medical History  Diagnosis Date  . Anxiety   . Asthma   . GERD (gastroesophageal reflux disease)   . Allergy     RHINITIS  . Hyperlipidemia   . Pre-diabetes   . Acid reflux   . Insomnia     History reviewed. No pertinent past surgical history.  Family History  Problem Relation Age of Onset  . Heart disease Father     CORNARY HEART DISEASE/ 2 BYPASS SURGERIES  . Cancer Father     PROSTATE  . Heart disease Paternal Grandfather     CORNARY HEART DISEASE  . Arthritis Paternal Grandfather     History  Substance Use Topics  . Smoking status: Former Smoker    Types: Cigarettes    Quit date: 12/29/2012  . Smokeless tobacco: Former Neurosurgeon    Quit date: 12/29/2012  . Alcohol Use: No      Review of Systems  All other systems reviewed and are negative.    Allergies  Review of patient's allergies indicates no known allergies.  Home Medications   Current Outpatient Rx  Name  Route  Sig  Dispense  Refill  . ibuprofen (ADVIL,MOTRIN) 200 MG tablet   Oral   Take 200-600 mg by mouth every 6 (six) hours as needed. For pain relief         . LORazepam (ATIVAN) 1 MG tablet   Oral   Take 1 mg by mouth at bedtime.         Marland Kitchen omeprazole (PRILOSEC OTC) 20 MG tablet   Oral   Take 20 mg by mouth daily as needed (heartburn).         .  ranitidine (ZANTAC) 150 MG tablet   Oral   Take 150 mg by mouth daily as needed for heartburn.            BP 134/85  Pulse 93  Temp(Src) 98.1 F (36.7 C)  Resp 20  SpO2 97%  Physical Exam  Nursing note and vitals reviewed. Constitutional: He is oriented to person, place, and time. He appears well-developed and well-nourished.  HENT:  Head: Normocephalic and atraumatic. No trismus in the jaw.  Mouth/Throat: Uvula is midline, oropharynx is clear and moist and mucous membranes are normal. No dental abscesses, edematous or dental caries. No oropharyngeal exudate, posterior oropharyngeal edema or tonsillar abscesses.  Eyes: EOM are normal.  Neck: Normal range of motion.  Cardiovascular: Normal rate, regular rhythm, normal heart sounds and intact distal pulses.   Pulmonary/Chest: Effort normal and breath sounds normal. No respiratory distress.  Abdominal: Soft. He exhibits no distension. There is no tenderness.  Genitourinary: Rectum normal.  Musculoskeletal: Normal range of motion.  Neurological: He is alert and oriented to person, place, and  time.  Skin: Skin is warm and dry.  Psychiatric: He has a normal mood and affect. Judgment normal.    ED Course  Procedures (including critical care time)  Labs Reviewed  RAPID STREP SCREEN  CULTURE, GROUP A STREP   No results found.   1. Pharyngitis       MDM  Viral pharyngitis.  Well appearing.  Vitals are normal        Lyanne Co, MD 02/05/13 [phone]

## 2013-02-06 LAB — CULTURE, GROUP A STREP

## 2013-02-07 NOTE — ED Notes (Signed)
Post ED Visit - Positive Culture Follow-up  Culture report reviewed by antimicrobial stewardship pharmacist: []  Wes Dulaney, Pharm.D., BCPS [x]  Celedonio Miyamoto, Pharm.D., BCPS []  Georgina Pillion, Pharm.D., BCPS []  [place], [place].D., BCPS, AAHIVP []  Estella Husk, Pharm.D., BCPS, AAHIV  Positive urine culture  no further patient follow-up is required at this time.  Larena Sox 02/07/2013, 2:59 PM

## 2013-04-04 ENCOUNTER — Emergency Department (HOSPITAL_COMMUNITY)
Admission: EM | Admit: 2013-04-04 | Discharge: 2013-04-04 | Disposition: A | Discharge status: Home / Self Care | Payer: BC Managed Care – PPO | Attending: Emergency Medicine | Admitting: Emergency Medicine

## 2013-04-04 ENCOUNTER — Encounter (HOSPITAL_COMMUNITY): Payer: Self-pay | Admitting: Emergency Medicine

## 2013-04-04 DIAGNOSIS — Z87891 Personal history of nicotine dependence: Secondary | ICD-10-CM | POA: Insufficient documentation

## 2013-04-04 DIAGNOSIS — J45909 Unspecified asthma, uncomplicated: Secondary | ICD-10-CM | POA: Insufficient documentation

## 2013-04-04 DIAGNOSIS — H9319 Tinnitus, unspecified ear: Secondary | ICD-10-CM | POA: Insufficient documentation

## 2013-04-04 DIAGNOSIS — H9209 Otalgia, unspecified ear: Secondary | ICD-10-CM | POA: Insufficient documentation

## 2013-04-04 DIAGNOSIS — Z79899 Other long term (current) drug therapy: Secondary | ICD-10-CM | POA: Insufficient documentation

## 2013-04-04 DIAGNOSIS — Z862 Personal history of diseases of the blood and blood-forming organs and certain disorders involving the immune mechanism: Secondary | ICD-10-CM | POA: Insufficient documentation

## 2013-04-04 DIAGNOSIS — Z8639 Personal history of other endocrine, nutritional and metabolic disease: Secondary | ICD-10-CM | POA: Insufficient documentation

## 2013-04-04 DIAGNOSIS — R0982 Postnasal drip: Secondary | ICD-10-CM | POA: Insufficient documentation

## 2013-04-04 DIAGNOSIS — F411 Generalized anxiety disorder: Secondary | ICD-10-CM | POA: Insufficient documentation

## 2013-04-04 DIAGNOSIS — H9202 Otalgia, left ear: Secondary | ICD-10-CM

## 2013-04-04 DIAGNOSIS — K219 Gastro-esophageal reflux disease without esophagitis: Secondary | ICD-10-CM | POA: Insufficient documentation

## 2013-04-04 DIAGNOSIS — R42 Dizziness and giddiness: Secondary | ICD-10-CM | POA: Insufficient documentation

## 2013-04-04 NOTE — ED Notes (Signed)
Pt c/o L ear pain, sinus pressure, sore throat x 2 days.

## 2013-04-04 NOTE — ED Notes (Signed)
Pt has had severe ear pain in left ear for 2 days

## 2013-04-04 NOTE — ED Provider Notes (Signed)
Medical screening examination/treatment/procedure(s) were performed by non-physician practitioner and as supervising physician I was immediately available for consultation/collaboration.   July Linam, MD 04/04/13 2327 

## 2013-04-04 NOTE — ED Provider Notes (Signed)
CSN: [id number]     Arrival date & time 04/04/13  2116 History    This chart was scribed for Rolan Bucco, MD, by Frederik Pear, ED scribe. The patient was seen in room WTR6/WTR6 and the patient's care was started at 2132.    First MD Initiated Contact with Patient 04/04/13 2132     Chief Complaint  Patient presents with  . Otalgia   (Consider location/radiation/quality/duration/timing/severity/associated sxs/prior Treatment) The history is provided by the patient and medical records. No language interpreter was used.    HPI Comments: Eddie Gross is a 37 y.o. male who presents to the Emergency Department complaining of worsening, constant left otalgia with associated intermittent ear pressure, tinnitus and dizziness, postnasal drip, and sinus pressure that began gradually 2 days ago. He denies nasal congestion. He reports he treated the symptoms with Sudafed, Benadryl, and ibuprofen at home with no relief.   PCP is Dr. Jean Rosenthal at [facility].  Past Medical History  Diagnosis Date  . Anxiety   . Asthma   . GERD (gastroesophageal reflux disease)   . Allergy     RHINITIS  . Hyperlipidemia   . Pre-diabetes   . Acid reflux   . Insomnia    History reviewed. No pertinent past surgical history. Family History  Problem Relation Age of Onset  . Heart disease Father     CORNARY HEART DISEASE/ 2 BYPASS SURGERIES  . Cancer Father     PROSTATE  . Heart disease Paternal Grandfather     CORNARY HEART DISEASE  . Arthritis Paternal Grandfather    History  Substance Use Topics  . Smoking status: Former Smoker    Types: Cigarettes    Quit date: 12/29/2012  . Smokeless tobacco: Former Neurosurgeon    Quit date: 12/29/2012  . Alcohol Use: No    Review of Systems A complete 10 system review of systems was obtained and all systems are negative except as noted in the HPI and PMH.  Allergies  Review of patient's allergies indicates no known allergies.  Home Medications   Current Outpatient  Rx  Name  Route  Sig  Dispense  Refill  . ibuprofen (ADVIL,MOTRIN) 200 MG tablet   Oral   Take 200-600 mg by mouth every 6 (six) hours as needed. For pain relief         . LORazepam (ATIVAN) 1 MG tablet   Oral   Take 1 mg by mouth at bedtime.         Marland Kitchen omeprazole (PRILOSEC OTC) 20 MG tablet   Oral   Take 20 mg by mouth daily as needed (heartburn).         . ranitidine (ZANTAC) 150 MG tablet   Oral   Take 150 mg by mouth daily as needed for heartburn.           BP 131/81  Pulse 92  Temp(Src) 98 F (36.7 C) (Oral)  Resp 20  Ht 5\' 11"  (1.803 m)  Wt 234 lb (106.142 kg)  BMI 32.65 kg/m2  SpO2 100% Physical Exam  Nursing note and vitals reviewed. Constitutional: He is oriented to person, place, and time. He appears well-developed and well-nourished. No distress.  HENT:  Head: Normocephalic and atraumatic.  Right Ear: Tympanic membrane normal.  Mouth/Throat: Uvula is midline and oropharynx is clear and moist. No oropharyngeal exudate, posterior oropharyngeal edema or tonsillar abscesses.  Mild clear fluid behind the left TM. No erythema or discharge. No sinus tenderness. No mastoid tenderness  Eyes: EOM  are normal. Pupils are equal, round, and reactive to light.  Neck: Normal range of motion. Neck supple. No tracheal deviation present.  Cardiovascular: Normal rate, regular rhythm and normal heart sounds.  Exam reveals no gallop and no friction rub.   No murmur heard. Pulmonary/Chest: Effort normal. No respiratory distress. He has no wheezes. He has no rales. He exhibits no tenderness.  Abdominal: Soft. He exhibits no distension.  Musculoskeletal: Normal range of motion. He exhibits no edema.  Neurological: He is alert and oriented to person, place, and time.  Skin: Skin is warm and dry.  Psychiatric: He has a normal mood and affect. His behavior is normal.    ED Course   Procedures (including critical care time)  DIAGNOSTIC STUDIES: Oxygen Saturation is 100% on  room air, normal by my interpretation.    COORDINATION OF CARE:  21:45- Discussed planned course of treatment with the patient, including a home treatment plan of continuing OTC medications and resting at home, who is agreeable at this time.  Labs Reviewed - No data to display No results found. 1. Otalgia, left     MDM  Patient with left-sided otalgia. No evidence of acute bacterial infection. No fever, no chills, vomiting, or diarrhea. Patient requests antibiotics, however I do not think that antibiotics would benefit the patient. Do not believe that his symptoms are evident of bacterial sinusitis or otitis media. I recommended watch and wait period, and having the patient followup with his PCP. I discussed my reasoning for not giving the antibiotics, and the patient says "well I had to at least try." He is well-appearing, and is nontoxic. He is stable and ready for discharge. Discussed symptomatic therapy, which she understands and agrees with.  I personally performed the services described in this documentation, which was scribed in my presence. The recorded information has been reviewed and is accurate.     Roxy Horseman, PA-C 04/04/13 2220

## 2013-06-30 ENCOUNTER — Encounter (HOSPITAL_COMMUNITY): Payer: Self-pay | Admitting: Emergency Medicine

## 2013-06-30 ENCOUNTER — Emergency Department (HOSPITAL_COMMUNITY)
Admission: EM | Admit: 2013-06-30 | Discharge: 2013-06-30 | Discharge status: Against Medical Advice | Payer: BC Managed Care – PPO | Attending: Emergency Medicine | Admitting: Emergency Medicine

## 2013-06-30 ENCOUNTER — Emergency Department (HOSPITAL_COMMUNITY): Payer: BC Managed Care – PPO

## 2013-06-30 DIAGNOSIS — J90 Pleural effusion, not elsewhere classified: Secondary | ICD-10-CM | POA: Insufficient documentation

## 2013-06-30 DIAGNOSIS — J4489 Other specified chronic obstructive pulmonary disease: Secondary | ICD-10-CM | POA: Insufficient documentation

## 2013-06-30 DIAGNOSIS — Z87891 Personal history of nicotine dependence: Secondary | ICD-10-CM | POA: Insufficient documentation

## 2013-06-30 DIAGNOSIS — Z79899 Other long term (current) drug therapy: Secondary | ICD-10-CM | POA: Insufficient documentation

## 2013-06-30 DIAGNOSIS — E785 Hyperlipidemia, unspecified: Secondary | ICD-10-CM | POA: Insufficient documentation

## 2013-06-30 DIAGNOSIS — F411 Generalized anxiety disorder: Secondary | ICD-10-CM | POA: Insufficient documentation

## 2013-06-30 DIAGNOSIS — J309 Allergic rhinitis, unspecified: Secondary | ICD-10-CM | POA: Insufficient documentation

## 2013-06-30 DIAGNOSIS — R091 Pleurisy: Secondary | ICD-10-CM

## 2013-06-30 DIAGNOSIS — R071 Chest pain on breathing: Secondary | ICD-10-CM | POA: Insufficient documentation

## 2013-06-30 DIAGNOSIS — G4700 Insomnia, unspecified: Secondary | ICD-10-CM | POA: Insufficient documentation

## 2013-06-30 DIAGNOSIS — J45909 Unspecified asthma, uncomplicated: Secondary | ICD-10-CM | POA: Insufficient documentation

## 2013-06-30 DIAGNOSIS — K219 Gastro-esophageal reflux disease without esophagitis: Secondary | ICD-10-CM | POA: Insufficient documentation

## 2013-06-30 DIAGNOSIS — J449 Chronic obstructive pulmonary disease, unspecified: Secondary | ICD-10-CM | POA: Insufficient documentation

## 2013-06-30 LAB — CBC WITH DIFFERENTIAL/PLATELET
Basophils Absolute: 0.1 10*3/uL (ref 0.0–0.1)
Basophils Relative: 1 % (ref 0–1)
Eosinophils Relative: 7 % — ABNORMAL HIGH (ref 0–5)
Lymphocytes Relative: 20 % (ref 12–46)
Neutro Abs: 6 10*3/uL (ref 1.7–7.7)
Platelets: 196 10*3/uL (ref 150–400)
RDW: 13.1 % (ref 11.5–15.5)
WBC: 9.4 10*3/uL (ref 4.0–10.5)

## 2013-06-30 LAB — BASIC METABOLIC PANEL
CO2: 25 mEq/L (ref 19–32)
Calcium: 9.3 mg/dL (ref 8.4–10.5)
Chloride: 100 mEq/L (ref 96–112)
GFR calc Af Amer: >90 mL/min (ref >90)
Sodium: 136 mEq/L (ref 135–145)

## 2013-06-30 LAB — D-DIMER, QUANTITATIVE: D-Dimer, Quant: <0.27 ug/mL-FEU (ref 0.00–0.48)

## 2013-06-30 MED ORDER — IBUPROFEN 400 MG PO TABS
600.0000 mg | ORAL_TABLET | Freq: Once | ORAL | Status: AC
  Administered 2013-06-30: 600 mg via ORAL
  Filled 2013-06-30 (×2): qty 1

## 2013-06-30 NOTE — ED Provider Notes (Signed)
CSN: [id number]     Arrival date & time 06/30/13  1039 History   First MD Initiated Contact with Patient 06/30/13 1109     Chief Complaint  Patient presents with  . Shortness of Breath  . Cough   (Consider location/radiation/quality/duration/timing/severity/associated sxs/prior Treatment) HPI Comments: Pt comes in with cc of chest pain and dib. Has hx of COPD. States that for the past 2-3 days he has been having cough, with brown phlegm, and also some pleuritic type chest pain, right sided and dib. 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 DIB is sometimes even at rest. No fevers, chills.   Patient is a 37 y.o. male presenting with shortness of breath and cough. The history is provided by the patient.  Shortness of Breath Associated symptoms: cough   Associated symptoms: no abdominal pain, no chest pain and no wheezing   Cough Associated symptoms: shortness of breath   Associated symptoms: no chest pain and no wheezing     Past Medical History  Diagnosis Date  . Anxiety   . Asthma   . GERD (gastroesophageal reflux disease)   . Allergy     RHINITIS  . Hyperlipidemia   . Pre-diabetes   . Acid reflux   . Insomnia    History reviewed. No pertinent past surgical history. Family History  Problem Relation Age of Onset  . Heart disease Father     CORNARY HEART DISEASE/ 2 BYPASS SURGERIES  . Cancer Father     PROSTATE  . Heart disease Paternal Grandfather     CORNARY HEART DISEASE  . Arthritis Paternal Grandfather    History  Substance Use Topics  . Smoking status: Former Smoker    Types: Cigarettes    Quit date: 12/29/2012  . Smokeless tobacco: Former Neurosurgeon    Quit date: 12/29/2012  . Alcohol Use: No    Review of Systems  Constitutional: Negative for activity change and appetite change.  Respiratory: Positive for cough and shortness of breath. Negative for wheezing.   Cardiovascular: Negative for chest pain.  Gastrointestinal:  Negative for abdominal pain.  Genitourinary: Negative for dysuria.    Allergies  Review of patient's allergies indicates no known allergies.  Home Medications   Current Outpatient Rx  Name  Route  Sig  Dispense  Refill  . albuterol (PROVENTIL HFA;VENTOLIN HFA) 108 (90 BASE) MCG/ACT inhaler   Inhalation   Inhale 4 puffs into the lungs every 6 (six) hours as needed for wheezing.         Marland Kitchen ibuprofen (ADVIL,MOTRIN) 200 MG tablet   Oral   Take 200-600 mg by mouth every 6 (six) hours as needed. For pain relief         . LORazepam (ATIVAN) 1 MG tablet   Oral   Take 1 mg by mouth at bedtime.         . ranitidine (ZANTAC) 150 MG tablet   Oral   Take 150 mg by mouth daily.           BP 112/75  Pulse 81  Temp(Src) 98 F (36.7 C) (Oral)  Resp 18  Ht 5\' 11"  (1.803 m)  Wt 230 lb (104.327 kg)  BMI 32.09 kg/m2  SpO2 96% Physical Exam  Nursing note and vitals reviewed. Constitutional: He is oriented to person, place, and time. He appears well-developed.  HENT:  Head: Normocephalic and atraumatic.  Eyes: Conjunctivae and EOM are normal. Pupils are equal, round, and reactive  to light.  Neck: Normal range of motion. Neck supple.  Cardiovascular: Normal rate and regular rhythm.   Pulmonary/Chest: Effort normal and breath sounds normal. He has no wheezes.  Abdominal: Soft. Bowel sounds are normal. He exhibits no distension. There is no tenderness. There is no rebound and no guarding.  Neurological: He is alert and oriented to person, place, and time.  Skin: Skin is warm.    ED Course  Procedures (including critical care time) Labs Review Labs Reviewed  CBC WITH DIFFERENTIAL - Abnormal; Notable for the following:    MCHC 36.1 (*)    Eosinophils Relative 7 (*)    All other components within normal limits  BASIC METABOLIC PANEL  D-DIMER, QUANTITATIVE   Imaging Review Dg Chest 2 View (if Patient Has Fever And/or Copd)  06/30/2013   CLINICAL DATA:  Cough and congestion ;  chest pain  EXAM: CHEST  2 VIEW  COMPARISON:  November 26, 2012  FINDINGS: Lungs are clear. Heart size and pulmonary vascularity are normal. No adenopathy. No pneumothorax. No bone lesions.  IMPRESSION: No abnormality noted.   Electronically Signed   By: Bretta Bang M.D.   On: 06/30/2013 11:52    EKG Interpretation   None       MDM  No diagnosis found. Pt comes in with cc of dib, cough, and pleuritic chest pain. He is PERC negative, however, his exam is completely benign, CXR is negative, and he does have hx of recurrent long distance travels - so PE is a possibility .  Will get d-dimer as a rule out. WELLS score is 0.   3:57 PM Pt eloped  Dx. Pleurysy  Derwood Kaplan, MD 07/01/13 1557

## 2013-06-30 NOTE — ED Notes (Signed)
Pt c/o shortness of breath and cough for 2 days. Pt reports productive cough with brown secretions.

## 2013-06-30 NOTE — ED Notes (Signed)
Pt wanting to leave, states he doesn't have time to wait on results and had to work third shift

## 2013-12-27 ENCOUNTER — Emergency Department (HOSPITAL_COMMUNITY)
Admission: EM | Admit: 2013-12-27 | Discharge: 2013-12-27 | Disposition: A | Discharge status: Home / Self Care | Payer: BC Managed Care – PPO | Attending: Emergency Medicine | Admitting: Emergency Medicine

## 2013-12-27 ENCOUNTER — Emergency Department (HOSPITAL_COMMUNITY): Payer: BC Managed Care – PPO

## 2013-12-27 ENCOUNTER — Encounter (HOSPITAL_COMMUNITY): Payer: Self-pay | Admitting: Emergency Medicine

## 2013-12-27 DIAGNOSIS — F411 Generalized anxiety disorder: Secondary | ICD-10-CM | POA: Insufficient documentation

## 2013-12-27 DIAGNOSIS — Z87891 Personal history of nicotine dependence: Secondary | ICD-10-CM | POA: Insufficient documentation

## 2013-12-27 DIAGNOSIS — X58XXXA Exposure to other specified factors, initial encounter: Secondary | ICD-10-CM | POA: Insufficient documentation

## 2013-12-27 DIAGNOSIS — G8929 Other chronic pain: Secondary | ICD-10-CM | POA: Insufficient documentation

## 2013-12-27 DIAGNOSIS — J4489 Other specified chronic obstructive pulmonary disease: Secondary | ICD-10-CM | POA: Insufficient documentation

## 2013-12-27 DIAGNOSIS — R05 Cough: Secondary | ICD-10-CM

## 2013-12-27 DIAGNOSIS — Z8639 Personal history of other endocrine, nutritional and metabolic disease: Secondary | ICD-10-CM | POA: Insufficient documentation

## 2013-12-27 DIAGNOSIS — S336XXA Sprain of sacroiliac joint, initial encounter: Secondary | ICD-10-CM | POA: Insufficient documentation

## 2013-12-27 DIAGNOSIS — R059 Cough, unspecified: Secondary | ICD-10-CM

## 2013-12-27 DIAGNOSIS — K219 Gastro-esophageal reflux disease without esophagitis: Secondary | ICD-10-CM | POA: Insufficient documentation

## 2013-12-27 DIAGNOSIS — H9209 Otalgia, unspecified ear: Secondary | ICD-10-CM | POA: Insufficient documentation

## 2013-12-27 DIAGNOSIS — Y939 Activity, unspecified: Secondary | ICD-10-CM | POA: Insufficient documentation

## 2013-12-27 DIAGNOSIS — Z862 Personal history of diseases of the blood and blood-forming organs and certain disorders involving the immune mechanism: Secondary | ICD-10-CM | POA: Insufficient documentation

## 2013-12-27 DIAGNOSIS — S39012A Strain of muscle, fascia and tendon of lower back, initial encounter: Secondary | ICD-10-CM

## 2013-12-27 DIAGNOSIS — J449 Chronic obstructive pulmonary disease, unspecified: Secondary | ICD-10-CM | POA: Insufficient documentation

## 2013-12-27 DIAGNOSIS — J45909 Unspecified asthma, uncomplicated: Secondary | ICD-10-CM | POA: Insufficient documentation

## 2013-12-27 DIAGNOSIS — Z79899 Other long term (current) drug therapy: Secondary | ICD-10-CM | POA: Insufficient documentation

## 2013-12-27 DIAGNOSIS — Y929 Unspecified place or not applicable: Secondary | ICD-10-CM | POA: Insufficient documentation

## 2013-12-27 MED ORDER — METHOCARBAMOL 500 MG PO TABS: 500.0000 mg | ORAL_TABLET | Freq: Two times a day (BID) | ORAL | Status: DC

## 2013-12-27 NOTE — ED Notes (Signed)
Pt states he has had cough for past 2 days, thinks he has bronchitis. Pt states cough began with sore throat, but none now. Hx of copd. Pt states he has lower back pain with radiation down L leg, hx of bulging disc in past. Pt ambulatory.

## 2013-12-27 NOTE — ED Provider Notes (Signed)
CSN: [id number]     Arrival date & time 12/27/13  1621 History  This chart was scribed for non-physician practitioner, Fayrene HelperBowie Dunbar Buras, PA-C,working with Shelda JakesScott W. Zackowski, MD, by Karle PlumberJennifer Tensley, ED Scribe.  This patient was seen in room WTR7/WTR7 and the patient's care was started at 5:47 PM.  Chief Complaint  Patient presents with  . Cough  . Back Pain   The history is provided by the patient. No language interpreter was used.   HPI Comments:  Eddie Gross is a 38 y.o. male with h/o COPD with h/o bulging disc, who presents to the Emergency Department complaining of chronic lower back pain and cough that started two days ago. Pt states he received an MRI in 2004 and was diagnosed with a bulging disc that last flared up approximately one year ago. He states he has intermittent pain that starts in his lower back that radiates down his left leg down to his toes that last only a few seconds. Pt reports taking Tylenol and Ibuprofen with He denies bowel or bladder incontinence, dysuria, IV drug use, h/o PE or DVT. Pt is ambulatory without issue. He also reports a productive cough of greenish-brown phelgm, with associated otalgia, nasal congestion, and sneezing. He reports taking Sudafed, Ibuprofen, and using a nasal spray for his symptoms with mild relief. He denies hemoptysis, fever, or chills.  Past Medical History  Diagnosis Date  . Anxiety   . Asthma   . GERD (gastroesophageal reflux disease)   . Allergy     RHINITIS  . Hyperlipidemia   . Pre-diabetes   . Acid reflux   . Insomnia    History reviewed. No pertinent past surgical history. Family History  Problem Relation Age of Onset  . Heart disease Father     CORNARY HEART DISEASE/ 2 BYPASS SURGERIES  . Cancer Father     PROSTATE  . Heart disease Paternal Grandfather     CORNARY HEART DISEASE  . Arthritis Paternal Grandfather    History  Substance Use Topics  . Smoking status: Former Smoker    Types: Cigarettes    Quit date:  12/29/2012  . Smokeless tobacco: Former NeurosurgeonUser    Quit date: 12/29/2012  . Alcohol Use: No    Review of Systems  Constitutional: Negative for fever and chills.  HENT: Positive for congestion, ear pain and sneezing. Negative for sore throat.   Respiratory: Positive for cough.   Genitourinary: Negative for dysuria and difficulty urinating.  Musculoskeletal: Positive for back pain. Negative for gait problem.    Allergies  Review of patient's allergies indicates no known allergies.  Home Medications   Prior to Admission medications   Medication Sig Start Date End Date Taking? Authorizing Provider  albuterol (PROVENTIL HFA;VENTOLIN HFA) 108 (90 BASE) MCG/ACT inhaler Inhale 4 puffs into the lungs every 6 (six) hours as needed for wheezing.    Historical Provider, MD  ibuprofen (ADVIL,MOTRIN) 200 MG tablet Take 200-600 mg by mouth every 6 (six) hours as needed. For pain relief    Historical Provider, MD  LORazepam (ATIVAN) 1 MG tablet Take 1 mg by mouth at bedtime.    Historical Provider, MD  ranitidine (ZANTAC) 150 MG tablet Take 150 mg by mouth daily.     Historical Provider, MD   Triage Vitals: BP 129/85  Pulse 86  Temp(Src) 98.4 F (36.9 C) (Oral)  Resp 19  SpO2 97% Physical Exam  Nursing note and vitals reviewed. Constitutional: He is oriented to person, place, and time.  He appears well-developed and well-nourished.  HENT:  Head: Normocephalic and atraumatic.  Eyes: EOM are normal.  Neck: Normal range of motion.  Cardiovascular: Normal rate, regular rhythm and normal heart sounds.  Exam reveals no gallop and no friction rub.   No murmur heard. Dorsalis pedal pulses. BLE without palpable cords, erythema, or edema. Negative Holman sign.  Pulmonary/Chest: Effort normal and breath sounds normal. No respiratory distress. He has no wheezes. He has no rales.  Musculoskeletal: Normal range of motion.  Lumbar spine tenderness. Negative straight leg raises.  Neurological: He is alert  and oriented to person, place, and time.  Patellar reflexes intact.  Skin: Skin is warm and dry.  Psychiatric: He has a normal mood and affect. His behavior is normal.    ED Course  Procedures (including critical care time) DIAGNOSTIC STUDIES: Oxygen Saturation is 97% on RA, normal by my interpretation.   COORDINATION OF CARE: 5:55 PM- Pt states he wants to sign himself out AMA because he "has things to do". Pt verbalizes understanding and agrees to plan.  Medications - No data to display  Labs Review Labs Reviewed - No data to display  Imaging Review Dg Chest 2 View  12/27/2013   CLINICAL DATA:  Cough for 2 days. History of bronchitis. Ex-smoker.  EXAM: CHEST  2 VIEW  COMPARISON:  06/30/2013  FINDINGS: Midline trachea. Normal heart size and mediastinal contours. No pleural effusion or pneumothorax. Mild bibasilar volume loss, chronic. Lungs otherwise clear.  IMPRESSION: No acute cardiopulmonary disease.   Electronically Signed   By: Jeronimo GreavesKyle  Talbot M.D.   On: 12/27/2013 17:45     EKG Interpretation None      MDM   Final diagnoses:  Low back strain  Cough    BP 129/85  Pulse 86  Temp(Src) 98.4 F (36.9 C) (Oral)  Resp 19  SpO2 97%   I personally performed the services described in this documentation, which was scribed in my presence. The recorded information has been reviewed and is accurate.    Fayrene HelperBowie Shekera Beavers, PA-C 12/31/13 [phone]

## 2013-12-27 NOTE — Discharge Instructions (Signed)
Lumbosacral Strain Lumbosacral strain is a strain of any of the parts that make up your lumbosacral vertebrae. Your lumbosacral vertebrae are the bones that make up the lower third of your backbone. Your lumbosacral vertebrae are held together by muscles and tough, fibrous tissue (ligaments).  CAUSES  A sudden blow to your back can cause lumbosacral strain. Also, anything that causes an excessive stretch of the muscles in the low back can cause this strain. This is typically seen when people exert themselves strenuously, fall, lift heavy objects, bend, or crouch repeatedly. RISK FACTORS  Physically demanding work.  Participation in pushing or pulling sports or sports that require sudden twist of the back (tennis, golf, baseball).  Weight lifting.  Excessive lower back curvature.  Forward-tilted pelvis.  Weak back or abdominal muscles or both.  Tight hamstrings. SIGNS AND SYMPTOMS  Lumbosacral strain may cause pain in the area of your injury or pain that moves (radiates) down your leg.  DIAGNOSIS Your health care provider can often diagnose lumbosacral strain through a physical exam. In some cases, you may need tests such as X-ray exams.  TREATMENT  Treatment for your lower back injury depends on many factors that your clinician will have to evaluate. However, most treatment will include the use of anti-inflammatory medicines. HOME CARE INSTRUCTIONS   Avoid hard physical activities (tennis, racquetball, waterskiing) if you are not in proper physical condition for it. This may aggravate or create problems.  If you have a back problem, avoid sports requiring sudden body movements. Swimming and walking are generally safer activities.  Maintain good posture.  Maintain a healthy weight.  For acute conditions, you may put ice on the injured area.  Put ice in a plastic bag.  Place a towel between your skin and the bag.  Leave the ice on for 20 minutes, 2 3 times a day.  When the  low back starts healing, stretching and strengthening exercises may be recommended. SEEK MEDICAL CARE IF:  Your back pain is getting worse.  You experience severe back pain not relieved with medicines. SEEK IMMEDIATE MEDICAL CARE IF:   You have numbness, tingling, weakness, or problems with the use of your arms or legs.  There is a change in bowel or bladder control.  You have increasing pain in any area of the body, including your belly (abdomen).  You notice shortness of breath, dizziness, or feel faint.  You feel sick to your stomach (nauseous), are throwing up (vomiting), or become sweaty.  You notice discoloration of your toes or legs, or your feet get very cold. MAKE SURE YOU:   Understand these instructions.  Will watch your condition.  Will get help right away if you are not doing well or get worse. Document Released: 06/07/2005 Document Revised: 06/18/2013 Document Reviewed: 04/16/2013 [facility]Exit[facility] Patient Information 2014 [place]ExitCare, [place]LL[place]. Upper Respiratory Infection, Adult An upper respiratory infection (URI) is also known as the common cold. It is often caused by a type of germ (virus). Colds are easily spread (contagious). You can pass it to others by kissing, coughing, sneezing, or drinking out of the same glass. Usually, you get better in 1 or 2 weeks.  HOME CARE   Only take medicine as told by your doctor.  Use a warm mist humidifier or breathe in steam from a hot shower.  Drink enough water and fluids to keep your pee (urine) clear or pale yellow.  Get plenty of rest.  Return to work when your temperature is back to normal  or as told by your doctor. You may use a face mask and wash your hands to stop your cold from spreading. GET HELP RIGHT AWAY IF:   After the first few days, you feel you are getting worse.  You have questions about your medicine.  You have chills, shortness of breath, or brown or red spit (mucus).  You have yellow or brown snot (nasal  discharge) or pain in the face, especially when you bend forward.  You have a fever, puffy (swollen) neck, pain when you swallow, or white spots in the back of your throat.  You have a bad headache, ear pain, sinus pain, or chest pain.  You have a high-pitched whistling sound when you breathe in and out (wheezing).  You have a lasting cough or cough up blood.  You have sore muscles or a stiff neck. MAKE SURE YOU:   Understand these instructions.  Will watch your condition.  Will get help right away if you are not doing well or get worse. Document Released: 02/14/2008 Document Revised: 11/20/2011 Document Reviewed: 01/02/2011 [facility]Exit[facility] Patient Information 2014 [place]ExitCare, [place]LL[place].

## 2013-12-27 NOTE — ED Notes (Signed)
Patient returned from X-ray 

## 2013-12-31 NOTE — ED Provider Notes (Signed)
Medical screening examination/treatment/procedure(s) were performed by non-physician practitioner and as supervising physician I was immediately available for consultation/collaboration.   EKG Interpretation None        Shelda JakesScott W. Tomorrow Dehaas, MD 12/31/13 [phone]

## 2014-01-27 ENCOUNTER — Encounter (HOSPITAL_COMMUNITY): Payer: Self-pay | Admitting: Emergency Medicine

## 2014-01-27 ENCOUNTER — Emergency Department (HOSPITAL_COMMUNITY)
Admission: EM | Admit: 2014-01-27 | Discharge: 2014-01-27 | Disposition: A | Discharge status: Home / Self Care | Payer: BC Managed Care – PPO | Attending: Emergency Medicine | Admitting: Emergency Medicine

## 2014-01-27 ENCOUNTER — Emergency Department (HOSPITAL_COMMUNITY): Payer: BC Managed Care – PPO

## 2014-01-27 DIAGNOSIS — Z8659 Personal history of other mental and behavioral disorders: Secondary | ICD-10-CM | POA: Insufficient documentation

## 2014-01-27 DIAGNOSIS — M5416 Radiculopathy, lumbar region: Secondary | ICD-10-CM

## 2014-01-27 DIAGNOSIS — Z8639 Personal history of other endocrine, nutritional and metabolic disease: Secondary | ICD-10-CM | POA: Insufficient documentation

## 2014-01-27 DIAGNOSIS — M545 Low back pain, unspecified: Secondary | ICD-10-CM

## 2014-01-27 DIAGNOSIS — Z79899 Other long term (current) drug therapy: Secondary | ICD-10-CM | POA: Insufficient documentation

## 2014-01-27 DIAGNOSIS — J45909 Unspecified asthma, uncomplicated: Secondary | ICD-10-CM | POA: Insufficient documentation

## 2014-01-27 DIAGNOSIS — Z8669 Personal history of other diseases of the nervous system and sense organs: Secondary | ICD-10-CM | POA: Insufficient documentation

## 2014-01-27 DIAGNOSIS — Y9389 Activity, other specified: Secondary | ICD-10-CM | POA: Insufficient documentation

## 2014-01-27 DIAGNOSIS — F17200 Nicotine dependence, unspecified, uncomplicated: Secondary | ICD-10-CM | POA: Insufficient documentation

## 2014-01-27 DIAGNOSIS — Y9289 Other specified places as the place of occurrence of the external cause: Secondary | ICD-10-CM | POA: Insufficient documentation

## 2014-01-27 DIAGNOSIS — Z862 Personal history of diseases of the blood and blood-forming organs and certain disorders involving the immune mechanism: Secondary | ICD-10-CM | POA: Insufficient documentation

## 2014-01-27 DIAGNOSIS — M5430 Sciatica, unspecified side: Secondary | ICD-10-CM | POA: Insufficient documentation

## 2014-01-27 DIAGNOSIS — K219 Gastro-esophageal reflux disease without esophagitis: Secondary | ICD-10-CM | POA: Insufficient documentation

## 2014-01-27 MED ORDER — PREDNISONE 10 MG PO TABS: 20.0000 mg | ORAL_TABLET | Freq: Every day | ORAL | Status: DC

## 2014-01-27 MED ORDER — HYDROCODONE-ACETAMINOPHEN 5-325 MG PO TABS
1.0000 | ORAL_TABLET | Freq: Once | ORAL | Status: AC
  Administered 2014-01-27: 1 via ORAL
  Filled 2014-01-27: qty 1

## 2014-01-27 MED ORDER — HYDROCODONE-ACETAMINOPHEN 5-325 MG PO TABS: 1.0000 | ORAL_TABLET | ORAL | Status: DC | PRN

## 2014-01-27 MED ORDER — METHOCARBAMOL 500 MG PO TABS: 500.0000 mg | ORAL_TABLET | Freq: Two times a day (BID) | ORAL | Status: DC

## 2014-01-27 NOTE — ED Notes (Signed)
Patient transported to X-ray 

## 2014-01-27 NOTE — Discharge Instructions (Signed)
Your x-rays did not show any concerning findings after your slip and fall. Please use the medications prescribed to help with your pain and symptoms. Followup with your primary care provider for continued evaluation and treatment.    Back Pain, Adult Low back pain is very common. About 1 in 5 people have back pain.The cause of low back pain is rarely dangerous. The pain often gets better over time.About half of people with a sudden onset of back pain feel better in just 2 weeks. About 8 in 10 people feel better by 6 weeks.  CAUSES Some common causes of back pain include:  Strain of the muscles or ligaments supporting the spine.  Wear and tear (degeneration) of the spinal discs.  Arthritis.  Direct injury to the back. DIAGNOSIS Most of the time, the direct cause of low back pain is not known.However, back pain can be treated effectively even when the exact cause of the pain is unknown.Answering your caregiver's questions about your overall health and symptoms is one of the most accurate ways to make sure the cause of your pain is not dangerous. If your caregiver needs more information, he or she may order lab work or imaging tests (X-rays or MRIs).However, even if imaging tests show changes in your back, this usually does not require surgery. HOME CARE INSTRUCTIONS For many people, back pain returns.Since low back pain is rarely dangerous, it is often a condition that people can learn to [facility] Centermanageon their own.   Remain active. It is stressful on the back to sit or stand in one place. Do not sit, drive, or stand in one place for more than 30 minutes at a time. Take short walks on level surfaces as soon as pain allows.Try to increase the length of time you walk each day.  Do not stay in bed.Resting more than 1 or 2 days can delay your recovery.  Do not avoid exercise or work.Your body is made to move.It is not dangerous to be active, even though your back may hurt.Your back will likely  heal faster if you return to being active before your pain is gone.  Pay attention to your body when you bend and lift. Many people have less discomfortwhen lifting if they bend their knees, keep the load close to their bodies,and avoid twisting. Often, the most comfortable positions are those that put less stress on your recovering back.  Find a comfortable position to sleep. Use a firm mattress and lie on your side with your knees slightly bent. If you lie on your back, put a pillow under your knees.  Only take over-the-counter or prescription medicines as directed by your caregiver. Over-the-counter medicines to reduce pain and inflammation are often the most helpful.Your caregiver may prescribe muscle relaxant drugs.These medicines help dull your pain so you can more quickly return to your normal activities and healthy exercise.  Put ice on the injured area.  Put ice in a plastic bag.  Place a towel between your skin and the bag.  Leave the ice on for 15-20 minutes, 03-04 times a day for the first 2 to 3 days. After that, ice and heat may be alternated to reduce pain and spasms.  Ask your caregiver about trying back exercises and gentle massage. This may be of some benefit.  Avoid feeling anxious or stressed.Stress increases muscle tension and can worsen back pain.It is important to recognize when you are anxious or stressed and learn ways to manage it.Exercise is a great option. SEEK  MEDICAL CARE IF:  You have pain that is not relieved with rest or medicine.  You have pain that does not improve in 1 week.  You have new symptoms.  You are generally not feeling well. SEEK IMMEDIATE MEDICAL CARE IF:   You have pain that radiates from your back into your legs.  You develop new bowel or bladder control problems.  You have unusual weakness or numbness in your arms or legs.  You develop nausea or vomiting.  You develop abdominal pain.  You feel faint. Document Released:  08/28/2005 Document Revised: 02/27/2012 Document Reviewed: 01/16/2011 [facility] TownshipExitCare Patient Information 2014 [place] VistaExitCare, MarylandLLC.     Back Exercises Back exercises help treat and prevent back injuries. The goal is to increase your strength in your belly (abdominal) and back muscles. These exercises can also help with flexibility. Start these exercises when told by your doctor. HOME CARE Back exercises include: Pelvic Tilt.  Lie on your back with your knees bent. Tilt your pelvis until the lower part of your back is against the floor. Hold this position 5 to 10 sec. Repeat this exercise 5 to 10 times. Knee to Chest.  Pull 1 knee up against your chest and hold for 20 to 30 seconds. Repeat this with the other knee. This may be done with the other leg straight or bent, whichever feels better. Then, pull both knees up against your chest. Sit-Ups or Curl-Ups.  Bend your knees 90 degrees. Start with tilting your pelvis, and do a partial, slow sit-up. Only lift your upper half 30 to 45 degrees off the floor. Take at least 2 to 3 seonds for each sit-up. Do not do sit-ups with your knees out straight. If partial sit-ups are difficult, simply do the above but with only tightening your belly (abdominal) muscles and holding it as told. Hip-Lift.  Lie on your back with your knees flexed 90 degrees. Push down with your feet and shoulders as you raise your hips 2 inches off the floor. Hold for 10 seconds, repeat 5 to 10 times. Back Arches.  Lie on your stomach. Prop yourself up on bent elbows. Slowly press on your hands, causing an arch in your low back. Repeat 3 to 5 times. Shoulder-Lifts.  Lie face down with arms beside your body. Keep hips and belly pressed to floor as you slowly lift your head and shoulders off the floor. Do not overdo your exercises. Be careful in the beginning. Exercises may cause you some mild back discomfort. If the pain lasts for more than 15 minutes, stop the exercises until you see  your doctor. Improvement with exercise for back problems is slow.  Document Released: 09/30/2010 Document Revised: 11/20/2011 Document Reviewed: 06/29/2011 [facility]Exit[facility] Patient Information 2014 [place] LakeExitCare, MarylandLLC.

## 2014-01-27 NOTE — ED Notes (Signed)
Pt states he was getting out of truck and slipped; pt denies completely falling states he caught himself; pt states " I believe I slipped a disk in my back"; pt c/o pain going down left leg " like a lighting bolt"; c/o 8/10

## 2014-01-27 NOTE — ED Provider Notes (Signed)
CSN: [id number]     Arrival date & time 01/27/14  0301 History   First MD Initiated Contact with Patient 01/27/14 0335     Chief Complaint  Patient presents with  . Back Pain   HPI  History provided by the patient. Patient is a 38 year old male with history of asthma, hyperlipidemia, GERD, anxiety and back pain and presents with complaints of fall back injury and pain. Patient states that he has returned from work early this morning and as he was getting out of his truck slipped. He was able to catch himself reports a sharp hot pain in his low back with radiating pain down his left leg. Describes the pain as sharp like a lightening bolt electric down his left leg all the way to the foot. Pain does not change with any kind of movements or position. He did not take any medications at home to help with symptoms. He was brought to the emergency room by his father who he called. He does report having similar low back pain problems in the past occasionally radiating pains down his leg. Denies any other injury from slipping from a truck. No head injury or LOC. No other aggravating or alleviating factors. No other associated symptoms. No urinary or fecal incontinence, urinary retention or perineal numbness.    Past Medical History  Diagnosis Date  . Anxiety   . Asthma   . GERD (gastroesophageal reflux disease)   . Allergy     RHINITIS  . Hyperlipidemia   . Pre-diabetes   . Acid reflux   . Insomnia    History reviewed. No pertinent past surgical history. Family History  Problem Relation Age of Onset  . Heart disease Father     CORNARY HEART DISEASE/ 2 BYPASS SURGERIES  . Cancer Father     PROSTATE  . Heart disease Paternal Grandfather     CORNARY HEART DISEASE  . Arthritis Paternal Grandfather    History  Substance Use Topics  . Smoking status: Current Every Day Smoker -- 1.00 packs/day    Types: Cigarettes    Last Attempt to Quit: 12/29/2012  . Smokeless tobacco: Former NeurosurgeonUser    Quit  date: 12/29/2012  . Alcohol Use: No    Review of Systems  Musculoskeletal: Positive for back pain.  Neurological: Negative for weakness and numbness.  All other systems reviewed and are negative.     Allergies  Review of patient's allergies indicates no known allergies.  Home Medications   Prior to Admission medications   Medication Sig Start Date End Date Taking? Authorizing Provider  albuterol (PROVENTIL HFA;VENTOLIN HFA) 108 (90 BASE) MCG/ACT inhaler Inhale 4 puffs into the lungs every 6 (six) hours as needed for wheezing.   Yes Historical Provider, MD  ibuprofen (ADVIL,MOTRIN) 200 MG tablet Take 200-600 mg by mouth every 6 (six) hours as needed. For pain relief   Yes Historical Provider, MD  LORazepam (ATIVAN) 1 MG tablet Take 1 mg by mouth at bedtime.   Yes Historical Provider, MD  ranitidine (ZANTAC) 150 MG tablet Take 150 mg by mouth daily.    Yes Historical Provider, MD   BP 116/75  Pulse 98  Temp(Src) 98.2 F (36.8 C) (Oral)  Resp 18  SpO2 97% Physical Exam  Nursing note and vitals reviewed. Constitutional: He is oriented to person, place, and time. He appears well-developed and well-nourished. No distress.  HENT:  Head: Normocephalic.  Cardiovascular: Normal rate and regular rhythm.   Pulmonary/Chest: Effort normal and breath sounds  normal. No respiratory distress. He has no wheezes. He has no rales.  Abdominal: Soft. There is no tenderness.  Musculoskeletal:       Cervical back: Normal.       Thoracic back: Normal.       Lumbar back: He exhibits tenderness. He exhibits no edema and no deformity.       Back:  Neurological: He is alert and oriented to person, place, and time. He has normal strength. No cranial nerve deficit or sensory deficit.  Reflex Scores:      Patellar reflexes are 2+ on the right side and 2+ on the left side.   ED Course  Procedures   COORDINATION OF CARE:  Nursing notes reviewed. Vital signs reviewed. Initial pt interview and  examination performed.   Filed Vitals:   01/27/14 0306 01/27/14 0308 01/27/14 0310 01/27/14 0345  BP: 149/83   116/75  Pulse: 104   98  Temp: 98.2 F (36.8 C)     TempSrc: Oral     Resp: 18     SpO2: 99% 99% 99% 97%    3:50AM - patient seen and evaluated. Patient sitting appears comfortable in no acute distress. Does not appear in significant pain or discomfort. No concerning or red flag symptoms.   Treatment plan initiated: Medications  HYDROcodone-acetaminophen (NORCO/VICODIN) 5-325 MG per tablet 1 tablet (1 tablet Oral Given 01/27/14 0402)      Imaging Review Dg Lumbar Spine Complete  01/27/2014   CLINICAL DATA:  Twisted back getting out of a truck.  EXAM: LUMBAR SPINE - COMPLETE 4+ VIEW  COMPARISON:  CT ABD/PELVIS W CM dated 01/21/2013  FINDINGS: Five non rib-bearing lumbar-type vertebral bodies are intact and aligned with maintenance of the lumbar lordosis. Intervertebral disc heights are normal. Mild lumbar ventral endplate spurring. No destructive bony lesions. No pars interarticularis defects.  Sacroiliac joints are symmetric. Included prevertebral and paraspinal soft tissue planes are non-suspicious.  IMPRESSION: No acute fracture deformity nor malalignment.   Electronically Signed   By: Awilda Metroourtnay  Bloomer   On: 01/27/2014 04:17     MDM   Final diagnoses:  Low back pain  Lumbar radicular pain       Angus Sellereter S Marri Mcneff, PA-C 01/27/14 2304

## 2014-01-28 NOTE — ED Provider Notes (Signed)
Medical screening examination/treatment/procedure(s) were performed by non-physician practitioner and as supervising physician I was immediately available for consultation/collaboration.   Dione Boozeavid Princella Jaskiewicz, MD 01/28/14 [phone]

## 2014-02-04 DIAGNOSIS — E781 Pure hyperglyceridemia: Secondary | ICD-10-CM | POA: Insufficient documentation

## 2014-03-24 ENCOUNTER — Emergency Department (HOSPITAL_BASED_OUTPATIENT_CLINIC_OR_DEPARTMENT_OTHER)
Admission: EM | Admit: 2014-03-24 | Discharge: 2014-03-24 | Disposition: A | Discharge status: Home / Self Care | Payer: BC Managed Care – PPO | Attending: Emergency Medicine | Admitting: Emergency Medicine

## 2014-03-24 ENCOUNTER — Encounter (HOSPITAL_BASED_OUTPATIENT_CLINIC_OR_DEPARTMENT_OTHER): Payer: Self-pay | Admitting: Emergency Medicine

## 2014-03-24 DIAGNOSIS — IMO0002 Reserved for concepts with insufficient information to code with codable children: Secondary | ICD-10-CM | POA: Insufficient documentation

## 2014-03-24 DIAGNOSIS — F411 Generalized anxiety disorder: Secondary | ICD-10-CM | POA: Insufficient documentation

## 2014-03-24 DIAGNOSIS — Y9389 Activity, other specified: Secondary | ICD-10-CM | POA: Insufficient documentation

## 2014-03-24 DIAGNOSIS — J45909 Unspecified asthma, uncomplicated: Secondary | ICD-10-CM | POA: Insufficient documentation

## 2014-03-24 DIAGNOSIS — T304 Corrosion of unspecified body region, unspecified degree: Secondary | ICD-10-CM

## 2014-03-24 DIAGNOSIS — F17200 Nicotine dependence, unspecified, uncomplicated: Secondary | ICD-10-CM | POA: Insufficient documentation

## 2014-03-24 DIAGNOSIS — E785 Hyperlipidemia, unspecified: Secondary | ICD-10-CM | POA: Insufficient documentation

## 2014-03-24 DIAGNOSIS — Z791 Long term (current) use of non-steroidal anti-inflammatories (NSAID): Secondary | ICD-10-CM | POA: Insufficient documentation

## 2014-03-24 DIAGNOSIS — Y9289 Other specified places as the place of occurrence of the external cause: Secondary | ICD-10-CM | POA: Insufficient documentation

## 2014-03-24 DIAGNOSIS — K219 Gastro-esophageal reflux disease without esophagitis: Secondary | ICD-10-CM | POA: Insufficient documentation

## 2014-03-24 DIAGNOSIS — Z79899 Other long term (current) drug therapy: Secondary | ICD-10-CM | POA: Insufficient documentation

## 2014-03-24 MED ORDER — HYDROCODONE-ACETAMINOPHEN 5-325 MG PO TABS: 1.0000 | ORAL_TABLET | Freq: Four times a day (QID) | ORAL | Status: DC | PRN

## 2014-03-24 MED ORDER — NAPROXEN 250 MG PO TABS
500.0000 mg | ORAL_TABLET | Freq: Once | ORAL | Status: AC
  Administered 2014-03-24: 500 mg via ORAL
  Filled 2014-03-24: qty 2

## 2014-03-24 MED ORDER — NAPROXEN 500 MG PO TABS: 500.0000 mg | ORAL_TABLET | Freq: Two times a day (BID) | ORAL | Status: DC

## 2014-03-24 NOTE — Discharge Instructions (Signed)
Chemical Burn Chemicals can burn the skin. A chemical burn should be rinsed with cool water and checked by an emergency doctor. Burn care is important to stop infection. Keep chemicals out of reach of children. Wear safety gloves when handling chemicals. HOME CARE  Wash your hands well before you change your bandage.  Change your bandage as often as told by your doctor.  Remove the old bandage. If the bandage sticks, soak it off with cool, clean water.  Gently clean the burn with mild soap and water.  Pat the burn dry with a clean, dry cloth.  Put a thin layer of medicated cream on the burn.  Put a clean bandage on as told by your doctor.  Keep the bandage clean and dry.  Raise (elevate) the burn for the first 24 hours. After that, follow your doctor's directions.  Only take medicines as told by your doctor.  Keep all your doctor visits. GET HELP RIGHT AWAY IF:  You have too much pain.  The skin near the burn is red, tender, puffy (swollen), or has red streaks.  The burn area has yellowish-white fluid (pus) or a bad smell coming from it.  You have a fever. MAKE SURE YOU:   Understand these instructions.  Will watch your condition.  Will get help right away if you are not doing well or get worse. Document Released: 10/05/2004 Document Revised: 11/20/2011 Document Reviewed: 02/23/2011 [facility] Patient Information 2015 [place], [place]. This information is not intended to replace advice given to you by your health care provider. Make sure you discuss any questions you have with your health care provider.  Burn Care Burns hurt your skin. When your skin is hurt, it is easier to get an infection. Follow your doctor's directions to help prevent an infection. HOME CARE  Wash your hands well before you change your bandage.  Change your bandage as often as told by your doctor.  Remove the old bandage. If the bandage sticks, soak it off with cool, clean water.  Gently clean  the burn with mild soap and water.  Pat the burn dry with a clean, dry cloth.  Put a thin layer of medicated cream on the burn.  Put a clean bandage on as told by your doctor.  Keep the bandage clean and dry.  Raise (elevate) the burn for the first 24 hours. After that, follow your doctor's directions.  Only take medicine as told by your doctor. GET HELP RIGHT AWAY IF:   You have too much pain.  The skin near the burn is red, tender, puffy (swollen), or has red streaks.  The burn area has yellowish white fluid (pus) or a bad smell coming from it.  You have a fever. MAKE SURE YOU:   Understand these instructions.  Will watch your condition.  Will get help right away if you are not doing well or get worse. Document Released: 06/06/2008 Document Revised: 11/20/2011 Document Reviewed: 01/18/2011 [facility] Patient Information 2015 [place]ExitCare, [place]LLC. This information is not intended to replace advice given to you by your health care provider. Make sure you discuss any questions you have with your health care provider.   Recommend using aloe vera locally applied to the area. Take the Naprosyn on a regular basis to help with the inflammation and pain. Can supplement with hydrocodone as needed for additional pain. Return for development of blisters. Suggest physical exam with her primary care Dr. tomorrow recommended time just recheck the area.

## 2014-03-24 NOTE — ED Provider Notes (Signed)
CSN: [id number]     Arrival date & time 03/24/14  0920 History   First MD Initiated Contact with Patient 03/24/14 [phone]     Chief Complaint  Patient presents with  . Burn     (Consider location/radiation/quality/duration/timing/severity/associated sxs/prior Treatment) Patient is a 38 y.o. male presenting with burn. The history is provided by the patient.  Burn Associated symptoms: no shortness of breath   . Patient applied the hair removal may or last evening to the groin area. Developed burning which is increased today patient has washed the narrow off. Patient able to void. No history of similar problem in the past. Never used product before though. Burning type pain 8/10.   Past Medical History  Diagnosis Date  . Anxiety   . Asthma   . GERD (gastroesophageal reflux disease)   . Allergy     RHINITIS  . Hyperlipidemia   . Pre-diabetes   . Acid reflux   . Insomnia    History reviewed. No pertinent past surgical history. Family History  Problem Relation Age of Onset  . Heart disease Father     CORNARY HEART DISEASE/ 2 BYPASS SURGERIES  . Cancer Father     PROSTATE  . Heart disease Paternal Grandfather     CORNARY HEART DISEASE  . Arthritis Paternal Grandfather    History  Substance Use Topics  . Smoking status: Current Every Day Smoker -- 1.00 packs/day    Types: Cigarettes    Last Attempt to Quit: 12/29/2012  . Smokeless tobacco: Former Neurosurgeon    Quit date: 12/29/2012  . Alcohol Use: No    Review of Systems  Constitutional: Negative for fever.  Eyes: Negative for visual disturbance.  Respiratory: Negative for shortness of breath.   Cardiovascular: Negative for chest pain.  Gastrointestinal: Negative for abdominal pain.  Genitourinary: Positive for scrotal swelling. Negative for difficulty urinating.  Musculoskeletal: Negative for back pain.  Skin: Positive for rash.  Neurological: Negative for headaches.  Hematological: Does not bruise/bleed easily.   Psychiatric/Behavioral: Negative for confusion.      Allergies  Review of patient's allergies indicates no known allergies.  Home Medications   Prior to Admission medications   Medication Sig Start Date End Date Taking? Authorizing Provider  albuterol (PROVENTIL HFA;VENTOLIN HFA) 108 (90 BASE) MCG/ACT inhaler Inhale 4 puffs into the lungs every 6 (six) hours as needed for wheezing.    Historical Provider, MD  HYDROcodone-acetaminophen (NORCO/VICODIN) 5-325 MG per tablet Take 1-2 tablets by mouth every 4 (four) hours as needed for moderate pain. 01/27/14   Angus Seller, PA-C  HYDROcodone-acetaminophen (NORCO/VICODIN) 5-325 MG per tablet Take 1-2 tablets by mouth every 6 (six) hours as needed for moderate pain. 03/24/14   Vanetta Mulders, MD  ibuprofen (ADVIL,MOTRIN) 200 MG tablet Take 200-600 mg by mouth every 6 (six) hours as needed. For pain relief    Historical Provider, MD  LORazepam (ATIVAN) 1 MG tablet Take 1 mg by mouth at bedtime.    Historical Provider, MD  methocarbamol (ROBAXIN) 500 MG tablet Take 1 tablet (500 mg total) by mouth 2 (two) times daily. 01/27/14   Phill Mutter Dammen, PA-C  naproxen (NAPROSYN) 500 MG tablet Take 1 tablet (500 mg total) by mouth 2 (two) times daily with a meal. 03/24/14   Vanetta Mulders, MD  predniSONE (DELTASONE) 10 MG tablet Take 2 tablets (20 mg total) by mouth daily. 01/27/14   Phill Mutter Dammen, PA-C  ranitidine (ZANTAC) 150 MG tablet Take 150 mg by mouth daily.  Historical Provider, MD   BP 136/76  Pulse 89  Temp(Src) 97.3 F (36.3 C) (Oral)  Resp 20  Ht 5\' 11"  (1.803 m)  Wt 218 lb (98.884 kg)  BMI 30.42 kg/m2  SpO2 100% Physical Exam  Nursing note and vitals reviewed. Constitutional: He is oriented to person, place, and time. He appears well-developed and well-nourished. No distress.  HENT:  Head: Normocephalic and atraumatic.  Mouth/Throat: Oropharynx is clear and moist.  Eyes: Conjunctivae and EOM are normal. Pupils are equal, round,  and reactive to light.  Neck: Normal range of motion. Neck supple.  Cardiovascular: Normal rate, regular rhythm and normal heart sounds.   Pulmonary/Chest: Effort normal and breath sounds normal. No respiratory distress.  Abdominal: Soft. Bowel sounds are normal. There is no tenderness.  Genitourinary:  Erythema to the area of the scrotum and in the inner thigh skin folds. No blistering. Consistent with a chemical burn.  Musculoskeletal: Normal range of motion.  Neurological: He is alert and oriented to person, place, and time. No cranial nerve deficit. He exhibits normal muscle tone. Coordination normal.  Skin: There is erythema.    ED Course  Procedures (including critical care time) Labs Review Labs Reviewed - No data to display  Imaging Review No results found.   EKG Interpretation None      MDM   Final diagnoses:  Chemical burn    Followup of with primary care Dr. scheduled for tomorrow. Recommend aloe vera. Local skin your rotation erythema no blistering. Treat with anti-inflammatories and pain medicine as needed. Patient has followup arranged for tomorrow.    Vanetta MuldersScott Casee Knepp, MD 03/24/14 1008

## 2014-03-24 NOTE — ED Notes (Signed)
States he was using Darene LamerNair hair removal last night in perineal area na developed burning, redness and pain.

## 2014-06-25 ENCOUNTER — Emergency Department (HOSPITAL_COMMUNITY)
Admission: EM | Admit: 2014-06-25 | Discharge: 2014-06-25 | Disposition: A | Discharge status: Home / Self Care | Payer: BC Managed Care – PPO | Attending: Emergency Medicine | Admitting: Emergency Medicine

## 2014-06-25 ENCOUNTER — Encounter (HOSPITAL_COMMUNITY): Payer: Self-pay | Admitting: Emergency Medicine

## 2014-06-25 ENCOUNTER — Emergency Department (HOSPITAL_COMMUNITY): Payer: BC Managed Care – PPO

## 2014-06-25 DIAGNOSIS — Z8639 Personal history of other endocrine, nutritional and metabolic disease: Secondary | ICD-10-CM | POA: Diagnosis not present

## 2014-06-25 DIAGNOSIS — K0889 Other specified disorders of teeth and supporting structures: Secondary | ICD-10-CM

## 2014-06-25 DIAGNOSIS — F419 Anxiety disorder, unspecified: Secondary | ICD-10-CM | POA: Insufficient documentation

## 2014-06-25 DIAGNOSIS — Z7951 Long term (current) use of inhaled steroids: Secondary | ICD-10-CM | POA: Diagnosis not present

## 2014-06-25 DIAGNOSIS — K088 Other specified disorders of teeth and supporting structures: Secondary | ICD-10-CM | POA: Diagnosis not present

## 2014-06-25 DIAGNOSIS — K219 Gastro-esophageal reflux disease without esophagitis: Secondary | ICD-10-CM | POA: Diagnosis not present

## 2014-06-25 DIAGNOSIS — Z720 Tobacco use: Secondary | ICD-10-CM | POA: Diagnosis not present

## 2014-06-25 DIAGNOSIS — G4700 Insomnia, unspecified: Secondary | ICD-10-CM | POA: Insufficient documentation

## 2014-06-25 DIAGNOSIS — R091 Pleurisy: Secondary | ICD-10-CM

## 2014-06-25 DIAGNOSIS — Z79899 Other long term (current) drug therapy: Secondary | ICD-10-CM | POA: Insufficient documentation

## 2014-06-25 DIAGNOSIS — R079 Chest pain, unspecified: Secondary | ICD-10-CM | POA: Diagnosis present

## 2014-06-25 LAB — CBC
HCT: 44.3 % (ref 39.0–52.0)
Hemoglobin: 16.2 g/dL (ref 13.0–17.0)
MCH: 32.9 pg (ref 26.0–34.0)
MCHC: 36.6 g/dL — AB (ref 30.0–36.0)
MCV: 89.9 fL (ref 78.0–100.0)
Platelets: 208 10*3/uL (ref 150–400)
RBC: 4.93 MIL/uL (ref 4.22–5.81)
RDW: 12.9 % (ref 11.5–15.5)
WBC: 9.2 10*3/uL (ref 4.0–10.5)

## 2014-06-25 LAB — BASIC METABOLIC PANEL
Anion gap: 14 (ref 5–15)
BUN: 11 mg/dL (ref 6–23)
CHLORIDE: 101 meq/L (ref 96–112)
CO2: 22 mEq/L (ref 19–32)
CREATININE: 0.99 mg/dL (ref 0.50–1.35)
Calcium: 9.3 mg/dL (ref 8.4–10.5)
GFR calc Af Amer: >90 mL/min (ref >90)
GFR calc non Af Amer: >90 mL/min (ref >90)
GLUCOSE: 95 mg/dL (ref 70–99)
POTASSIUM: 3.9 meq/L (ref 3.7–5.3)
Sodium: 137 mEq/L (ref 137–147)

## 2014-06-25 LAB — D-DIMER, QUANTITATIVE: D-Dimer, Quant: <0.27 ug/mL-FEU (ref 0.00–0.48)

## 2014-06-25 LAB — I-STAT TROPONIN, ED: Troponin i, poc: 0.01 ng/mL (ref 0.00–0.08)

## 2014-06-25 MED ORDER — OXYCODONE-ACETAMINOPHEN 5-325 MG PO TABS
1.0000 | ORAL_TABLET | Freq: Once | ORAL | Status: AC
  Administered 2014-06-25: 1 via ORAL
  Filled 2014-06-25: qty 1

## 2014-06-25 MED ORDER — NAPROXEN 500 MG PO TABS: 500.0000 mg | ORAL_TABLET | Freq: Two times a day (BID) | ORAL | Status: DC

## 2014-06-25 NOTE — ED Notes (Signed)
Pt remains monitored by blood pressure, pulse ox, and 5 lead.  

## 2014-06-25 NOTE — Discharge Instructions (Signed)
naproxyn for chest pain and dental pain. Lab work and chest x-ray normal here. Follow up with your doctor for recheck.  Follow up with your dentist regarding your toothache.    Dental Pain A tooth ache may be caused by cavities (tooth decay). Cavities expose the nerve of the tooth to air and hot or cold temperatures. It may come from an infection or abscess (also called a boil or furuncle) around your tooth. It is also often caused by dental caries (tooth decay). This causes the pain you are having. DIAGNOSIS  Your caregiver can diagnose this problem by exam. TREATMENT   If caused by an infection, it may be treated with medications which kill germs (antibiotics) and pain medications as prescribed by your caregiver. Take medications as directed.  Only take over-the-counter or prescription medicines for pain, discomfort, or fever as directed by your caregiver.  Whether the tooth ache today is caused by infection or dental disease, you should see your dentist as soon as possible for further care. SEEK MEDICAL CARE IF: The exam and treatment you received today has been provided on an emergency basis only. This is not a substitute for complete medical or dental care. If your problem worsens or new problems (symptoms) appear, and you are unable to meet with your dentist, call or return to this location. SEEK IMMEDIATE MEDICAL CARE IF:   You have a fever.  You develop redness and swelling of your face, jaw, or neck.  You are unable to open your mouth.  You have severe pain uncontrolled by pain medicine. MAKE SURE YOU:   Understand these instructions.  Will watch your condition.  Will get help right away if you are not doing well or get worse. Document Released: 08/28/2005 Document Revised: 11/20/2011 Document Reviewed: 04/15/2008 [facility] CenterExitCare Patient Information 2015 [place]ExitCare, [place]LLC. This information is not intended to replace advice given to you by your health care provider. Make sure you  discuss any questions you have with your health care provider. Chest Pain (Nonspecific) It is often hard to give a specific diagnosis for the cause of chest pain. There is always a chance that your pain could be related to something serious, such as a heart attack or a blood clot in the lungs. You need to follow up with your health care provider for further evaluation. CAUSES   Heartburn.  Pneumonia or bronchitis.  Anxiety or stress.  Inflammation around your heart (pericarditis) or lung (pleuritis or pleurisy).  A blood clot in the lung.  A collapsed lung (pneumothorax). It can develop suddenly on its own (spontaneous pneumothorax) or from trauma to the chest.  Shingles infection (herpes zoster virus). The chest wall is composed of bones, muscles, and cartilage. Any of these can be the source of the pain.  The bones can be bruised by injury.  The muscles or cartilage can be strained by coughing or overwork.  The cartilage can be affected by inflammation and become sore (costochondritis). DIAGNOSIS  Lab tests or other studies may be needed to find the cause of your pain. Your health care provider may have you take a test called an ambulatory electrocardiogram (ECG). An ECG records your heartbeat patterns over a 24-hour period. You may also have other tests, such as:  Transthoracic echocardiogram (TTE). During echocardiography, sound waves are used to evaluate how blood flows through your heart.  Transesophageal echocardiogram (TEE).  Cardiac monitoring. This allows your health care provider to monitor your heart rate and rhythm in real time.  Holter  monitor. This is a portable device that records your heartbeat and can help diagnose heart arrhythmias. It allows your health care provider to track your heart activity for several days, if needed.  Stress tests by exercise or by giving medicine that makes the heart beat faster. TREATMENT   Treatment depends on what may be causing  your chest pain. Treatment may include:  Acid blockers for heartburn.  Anti-inflammatory medicine.  Pain medicine for inflammatory conditions.  Antibiotics if an infection is present.  You may be advised to change lifestyle habits. This includes stopping smoking and avoiding alcohol, caffeine, and chocolate.  You may be advised to keep your head raised (elevated) when sleeping. This reduces the chance of acid going backward from your stomach into your esophagus. Most of the time, nonspecific chest pain will improve within 2-3 days with rest and mild pain medicine.  HOME CARE INSTRUCTIONS   If antibiotics were prescribed, take them as directed. Finish them even if you start to feel better.  For the next few days, avoid physical activities that bring on chest pain. Continue physical activities as directed.  Do not use any tobacco products, including cigarettes, chewing tobacco, or electronic cigarettes.  Avoid drinking alcohol.  Only take medicine as directed by your health care provider.  Follow your health care provider's suggestions for further testing if your chest pain does not go away.  Keep any follow-up appointments you made. If you do not go to an appointment, you could develop lasting (chronic) problems with pain. If there is any problem keeping an appointment, call to reschedule. SEEK MEDICAL CARE IF:   Your chest pain does not go away, even after treatment.  You have a rash with blisters on your chest.  You have a fever. SEEK IMMEDIATE MEDICAL CARE IF:   You have increased chest pain or pain that spreads to your arm, neck, jaw, back, or abdomen.  You have shortness of breath.  You have an increasing cough, or you cough up blood.  You have severe back or abdominal pain.  You feel nauseous or vomit.  You have severe weakness.  You faint.  You have chills. This is an emergency. Do not wait to see if the pain will go away. Get medical help at once. Call your  local emergency services (911 in [place] Do not drive yourself to the hospital. MAKE SURE YOU:   Understand these instructions.  Will watch your condition.  Will get help right away if you are not doing well or get worse. Document Released: 06/07/2005 Document Revised: 09/02/2013 Document Reviewed: 04/02/2008 [facility]Exit[facility] Patient Information 2015 [place]ExitCare, [place]LLC. This information is not intended to replace advice given to you by your health care provider. Make sure you discuss any questions you have with your health care provider.

## 2014-06-25 NOTE — ED Notes (Signed)
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  [place] Pt. Having rt. Side chest pain increases with deep breath.  Pt. Having nausea, and a cough.  Pt. Also reports having a lt. Upper tooth pain.  Skin is warm and dry.  Resp. E/u.  ECG  Done in triage.

## 2014-06-25 NOTE — ED Provider Notes (Signed)
CSN: [id number]     Arrival date & time 06/25/14  1251 History   First MD Initiated Contact with Patient 06/25/14 1438     Chief Complaint  Patient presents with  . Chest Pain     (Consider location/radiation/quality/duration/timing/severity/associated sxs/prior Treatment) HPI Eddie Gross is a 38 y.o. male  Who presents emergency department complaining of left upper tooth pain and right-sided chest pain. States both started this morning. [place].   States he got into town this morning when he started having the pain. He reports prior history of bone graft in his left maxilla from deep wisdom tooth extraction. He denies any facial swelling, fever, chills. He also states he's having right-sided chest pain is worsened with deep breathing. He denies any cough or congestion. No fever or chills. No injury to the chest. Reports history of asthma, no history of blood clots or cardiac problems. He denies any swelling in his legs, or calf pain. He denies prior history of the same.  Past Medical History  Diagnosis Date  . Anxiety   . Asthma   . GERD (gastroesophageal reflux disease)   . Allergy     RHINITIS  . Hyperlipidemia   . Pre-diabetes   . Acid reflux   . Insomnia    History reviewed. No pertinent past surgical history. Family History  Problem Relation Age of Onset  . Heart disease Father     CORNARY HEART DISEASE/ 2 BYPASS SURGERIES  . Cancer Father     PROSTATE  . Heart disease Paternal Grandfather     CORNARY HEART DISEASE  . Arthritis Paternal Grandfather    History  Substance Use Topics  . Smoking status: Current Every Day Smoker -- 1.00 packs/day    Types: Cigarettes    Last Attempt to Quit: 12/29/2012  . Smokeless tobacco: Former NeurosurgeonUser    Quit date: 12/29/2012  . Alcohol Use: No    Review of Systems  Constitutional: Negative for fever and chills.  HENT: Positive for dental problem. Negative for facial  swelling.   Respiratory: Positive for chest tightness. Negative for cough and shortness of breath.   Cardiovascular: Positive for chest pain. Negative for palpitations and leg swelling.  Gastrointestinal: Negative for nausea, vomiting, abdominal pain, diarrhea and abdominal distention.  Musculoskeletal: Negative for arthralgias, myalgias, neck pain and neck stiffness.  Skin: Negative for rash.  Allergic/Immunologic: Negative for immunocompromised state.  Neurological: Negative for dizziness, weakness, light-headedness, numbness and headaches.  All other systems reviewed and are negative.     Allergies  Review of patient's allergies indicates no known allergies.  Home Medications   Prior to Admission medications   Medication Sig Start Date End Date Taking? Authorizing Provider  albuterol (PROVENTIL HFA;VENTOLIN HFA) 108 (90 BASE) MCG/ACT inhaler Inhale 4 puffs into the lungs every 6 (six) hours as needed for wheezing.   Yes Historical Provider, MD  Cyanocobalamin (VITAMIN B-12 PO) Take 1 tablet by mouth daily.   Yes Historical Provider, MD  fluticasone (FLONASE) 50 MCG/ACT nasal spray Place 1 spray into both nostrils daily as needed for allergies or rhinitis.   Yes Historical Provider, MD  ibuprofen (ADVIL,MOTRIN) 200 MG tablet Take 200-600 mg by mouth every 6 (six) hours as needed. For pain relief   Yes Historical Provider, MD  LORazepam (ATIVAN) 1 MG tablet Take 1 mg by mouth at bedtime.   Yes Historical Provider, MD  ranitidine (ZANTAC) 150 MG tablet Take 150 mg by mouth  daily as needed for heartburn.    Yes Historical Provider, MD   BP 114/72  Pulse 83  Temp(Src) 97.7 F (36.5 C) (Oral)  Resp 20  SpO2 97% Physical Exam  Nursing note and vitals reviewed. Constitutional: He is oriented to person, place, and time. He appears well-developed and well-nourished. No distress.  HENT:  Head: Normocephalic and atraumatic.  Right Ear: External ear normal.  Left Ear: External ear  normal.  Nose: Nose normal.  Mouth/Throat: Oropharynx is clear and moist.  Normal dentition. Tender over left upper second molar, tooth appears to be normal. No surrounding gum swelling. No evidence of infection. No submandibular lymphadenopathy.  Eyes: Conjunctivae are normal.  Neck: Neck supple.  Cardiovascular: Normal rate, regular rhythm and normal heart sounds.   Pulmonary/Chest: Effort normal. No respiratory distress. He has no wheezes. He has no rales. He exhibits tenderness.  Tenderness over right anterior chest. No bruising or swelling. No deformity. No crepitus   Abdominal: Soft. Bowel sounds are normal. He exhibits no distension. There is no tenderness. There is no rebound.  Musculoskeletal: He exhibits no edema.  Neurological: He is alert and oriented to person, place, and time.  Skin: Skin is warm and dry.    ED Course  Procedures (including critical care time) Labs Review Labs Reviewed  CBC - Abnormal; Notable for the following:    MCHC 36.6 (*)    All other components within normal limits  BASIC METABOLIC PANEL  D-DIMER, QUANTITATIVE  I-STAT TROPOININ, ED    Imaging Review Dg Chest 2 View  06/25/2014   CLINICAL DATA:  38 year old male with history of chest pain, shortness of breath and cough.  EXAM: CHEST  2 VIEW  COMPARISON:  Chest x-ray 12/27/2013.  FINDINGS: Lung volumes are normal. No consolidative airspace disease. No pleural effusions. No pneumothorax. No pulmonary nodule or mass noted. Pulmonary vasculature and the cardiomediastinal silhouette are within normal limits.  IMPRESSION: No radiographic evidence of acute cardiopulmonary disease.   Electronically Signed   By: Trudie Reedaniel  Entrikin M.D.   On: 06/25/2014 14:46     EKG Interpretation   Date/Time:  Thursday June 25 2014 12:56:26 EDT Ventricular Rate:  92 PR Interval:  150 QRS Duration: 88 QT Interval:  336 QTC Calculation: 415 R Axis:   83 Text Interpretation:  Normal sinus rhythm with sinus  arrhythmia Normal ECG  since last tracing no significant change Confirmed by MILLER  MD, BRIAN  [phone]([id number]) on 06/25/2014 3:11:08 PM      MDM   Final diagnoses:  Pleurisy  Pain, dental    Pt with left upper toothache, no signs of infection based on exam. He is afebrile. Pt also has pleuritic cp. VS normal, not tachycardic, not hypoxic. Doubt PE although does have risk factor given recent travel. Will get a d dimer. Percocet ordered for pain. ECG normal. Labs unremarkable. CXR normal.    3:44 PM D dimer negative. Doubt PE. Atypical for ACS. Will d/c home with NSAIDs for possible pleurisy. Follow up with pcp and dentist.   Filed Vitals:   06/25/14 1258 06/25/14 1503 06/25/14 1516  BP: 138/83 114/72 117/77  Pulse: 83 83 78  Temp: 97.7 F (36.5 C)    TempSrc: Oral    Resp: 16 20 16   SpO2: 99% 97% 96%       Jaryah Aracena A Jalasia Eskridge, PA-C 06/25/14 1552

## 2014-06-25 NOTE — ED Provider Notes (Signed)
Medical screening examination/treatment/procedure(s) were conducted as a shared visit with non-physician practitioner(s) and myself.  I personally evaluated the patient during the encounter  Please see my separate respective documentation pertaining to this patient encounter   Vida RollerBrian D Damek Ende, MD 06/25/14 2044

## 2014-06-25 NOTE — ED Provider Notes (Signed)
Pt is a 38 y/o male with c/o CP as well as pain in the L jaw - he has prior surgery - this is a chronic condition.  On exam he has clear heart and lung sounds, no acute findings on ECG or labs and pain controlled with meds.  No signs of Ludwig's, normal oral cavity / tongue and neck.    Filed Vitals:   06/25/14 1258 06/25/14 1503 06/25/14 1516  BP: 138/83 114/72 117/77  Pulse: 83 83 78  Temp: 97.7 F (36.5 C)    TempSrc: Oral    Resp: 16 20 16   SpO2: 99% 97% 96%    EKG Interpretation  Date/Time:  Thursday June 25 2014 12:56:26 EDT Ventricular Rate:  92 PR Interval:  150 QRS Duration: 88 QT Interval:  336 QTC Calculation: 415 R Axis:   83 Text Interpretation:  Normal sinus rhythm with sinus arrhythmia Normal ECG since last tracing no significant change Confirmed by Jermond Burkemper  MD, Khiree Bukhari ([id number]) on 06/25/2014 3:11:08 PM       Meds given in ED:  Medications  oxyCODONE-acetaminophen (PERCOCET/ROXICET) 5-325 MG per tablet 1 tablet (1 tablet Oral Given 06/25/14 1529)    New Prescriptions   NAPROXEN (NAPROSYN) 500 MG TABLET    Take 1 tablet (500 mg total) by mouth 2 (two) times daily.    Medical screening examination/treatment/procedure(s) were conducted as a shared visit with non-physician practitioner(s) and myself.  I personally evaluated the patient during the encounter.  Clinical Impression:   Final diagnoses:  Pleurisy  Pain, dental          Vida RollerBrian D Kerron Sedano, MD 06/25/14 2044

## 2014-07-30 ENCOUNTER — Emergency Department (HOSPITAL_BASED_OUTPATIENT_CLINIC_OR_DEPARTMENT_OTHER)
Admission: EM | Admit: 2014-07-30 | Discharge: 2014-07-30 | Disposition: A | Discharge status: Home / Self Care | Payer: BC Managed Care – PPO | Attending: Emergency Medicine | Admitting: Emergency Medicine

## 2014-07-30 ENCOUNTER — Encounter (HOSPITAL_BASED_OUTPATIENT_CLINIC_OR_DEPARTMENT_OTHER): Payer: Self-pay | Admitting: Emergency Medicine

## 2014-07-30 DIAGNOSIS — G4700 Insomnia, unspecified: Secondary | ICD-10-CM | POA: Insufficient documentation

## 2014-07-30 DIAGNOSIS — F419 Anxiety disorder, unspecified: Secondary | ICD-10-CM | POA: Diagnosis not present

## 2014-07-30 DIAGNOSIS — Y998 Other external cause status: Secondary | ICD-10-CM | POA: Diagnosis not present

## 2014-07-30 DIAGNOSIS — Y9289 Other specified places as the place of occurrence of the external cause: Secondary | ICD-10-CM | POA: Diagnosis not present

## 2014-07-30 DIAGNOSIS — Z720 Tobacco use: Secondary | ICD-10-CM | POA: Insufficient documentation

## 2014-07-30 DIAGNOSIS — S39012A Strain of muscle, fascia and tendon of lower back, initial encounter: Secondary | ICD-10-CM | POA: Diagnosis not present

## 2014-07-30 DIAGNOSIS — Y9389 Activity, other specified: Secondary | ICD-10-CM | POA: Diagnosis not present

## 2014-07-30 DIAGNOSIS — Z79899 Other long term (current) drug therapy: Secondary | ICD-10-CM | POA: Insufficient documentation

## 2014-07-30 DIAGNOSIS — M5442 Lumbago with sciatica, left side: Secondary | ICD-10-CM

## 2014-07-30 DIAGNOSIS — Z8639 Personal history of other endocrine, nutritional and metabolic disease: Secondary | ICD-10-CM | POA: Diagnosis not present

## 2014-07-30 DIAGNOSIS — Z7951 Long term (current) use of inhaled steroids: Secondary | ICD-10-CM | POA: Insufficient documentation

## 2014-07-30 DIAGNOSIS — J45909 Unspecified asthma, uncomplicated: Secondary | ICD-10-CM | POA: Insufficient documentation

## 2014-07-30 DIAGNOSIS — X58XXXA Exposure to other specified factors, initial encounter: Secondary | ICD-10-CM | POA: Diagnosis not present

## 2014-07-30 DIAGNOSIS — K219 Gastro-esophageal reflux disease without esophagitis: Secondary | ICD-10-CM | POA: Insufficient documentation

## 2014-07-30 DIAGNOSIS — S3992XA Unspecified injury of lower back, initial encounter: Secondary | ICD-10-CM | POA: Diagnosis present

## 2014-07-30 MED ORDER — NAPROXEN 250 MG PO TABS
500.0000 mg | ORAL_TABLET | Freq: Once | ORAL | Status: DC
  Filled 2014-07-30: qty 2

## 2014-07-30 MED ORDER — NAPROXEN 500 MG PO TABS: 500.0000 mg | ORAL_TABLET | Freq: Two times a day (BID) | ORAL | Status: DC

## 2014-07-30 NOTE — ED Notes (Signed)
Pt not in room or waiting area, meds returned

## 2014-07-30 NOTE — ED Notes (Signed)
Pt states he was lifting a lawn mower out of the back of a truck and hurt his back

## 2014-07-30 NOTE — ED Provider Notes (Signed)
CSN: [id number]     Arrival date & time 07/30/14  1933 History   First MD Initiated Contact with Patient 07/30/14 2027     Chief Complaint  Patient presents with  . Back Pain     (Consider location/radiation/quality/duration/timing/severity/associated sxs/prior Treatment) HPI Comments: This is a 38 year old male with a past medical history of chronic back pain who presents to the emergency department complaining of low back pain radiating down his left leg beginning around 4:45 PM today while he was lifting a lawnmower out of the back of a truck. He states he felt something pull in his back and has been unable to move since. Pain worse with "just laying here". He has not tried any alleviating factors. Denies numbness or tingling down his extremities. Denies loss of control of bowels or bladder or saddle anesthesia. He drove himself to the emergency department.  Patient is a 38 y.o. male presenting with back pain. The history is provided by the patient.  Back Pain   Past Medical History  Diagnosis Date  . Anxiety   . Asthma   . GERD (gastroesophageal reflux disease)   . Allergy     RHINITIS  . Hyperlipidemia   . Pre-diabetes   . Acid reflux   . Insomnia    History reviewed. No pertinent past surgical history. Family History  Problem Relation Age of Onset  . Heart disease Father     CORNARY HEART DISEASE/ 2 BYPASS SURGERIES  . Cancer Father     PROSTATE  . Heart disease Paternal Grandfather     CORNARY HEART DISEASE  . Arthritis Paternal Grandfather    History  Substance Use Topics  . Smoking status: Current Every Day Smoker -- 1.00 packs/day    Types: Cigarettes    Last Attempt to Quit: 12/29/2012  . Smokeless tobacco: Former NeurosurgeonUser    Quit date: 12/29/2012  . Alcohol Use: No    Review of Systems  Musculoskeletal: Positive for back pain.   10 Systems reviewed and are negative for acute change except as noted in the HPI.   Allergies  Review of patient's allergies  indicates no known allergies.  Home Medications   Prior to Admission medications   Medication Sig Start Date End Date Taking? Authorizing Provider  albuterol (PROVENTIL HFA;VENTOLIN HFA) 108 (90 BASE) MCG/ACT inhaler Inhale 4 puffs into the lungs every 6 (six) hours as needed for wheezing.    Historical Provider, MD  Cyanocobalamin (VITAMIN B-12 PO) Take 1 tablet by mouth daily.    Historical Provider, MD  fluticasone (FLONASE) 50 MCG/ACT nasal spray Place 1 spray into both nostrils daily as needed for allergies or rhinitis.    Historical Provider, MD  ibuprofen (ADVIL,MOTRIN) 200 MG tablet Take 200-600 mg by mouth every 6 (six) hours as needed. For pain relief    Historical Provider, MD  LORazepam (ATIVAN) 1 MG tablet Take 1 mg by mouth at bedtime.    Historical Provider, MD  naproxen (NAPROSYN) 500 MG tablet Take 1 tablet (500 mg total) by mouth 2 (two) times daily. 07/30/14   Annaliz Aven M Sharene Krikorian, PA-C  ranitidine (ZANTAC) 150 MG tablet Take 150 mg by mouth daily as needed for heartburn.     Historical Provider, MD   BP 141/86 mmHg  Pulse 103  Temp(Src) 98.1 F (36.7 C) (Oral)  Resp 18  Ht 5\' 11"  (1.803 m)  Wt 220 lb (99.791 kg)  BMI 30.70 kg/m2  SpO2 96% Physical Exam  Constitutional: He is oriented to  person, place, and time. He appears well-developed and well-nourished. No distress.  HENT:  Head: Normocephalic and atraumatic.  Mouth/Throat: Oropharynx is clear and moist.  Eyes: Conjunctivae are normal.  Neck: Normal range of motion. Neck supple. No spinous process tenderness and no muscular tenderness present.  Cardiovascular: Normal rate, regular rhythm and normal heart sounds.   Pulmonary/Chest: Effort normal and breath sounds normal. No respiratory distress.  Musculoskeletal: He exhibits no edema.  Generalized tenderness across lower back. No specific focal tenderness. FROM, pain with flexion.  Neurological: He is alert and oriented to person, place, and time. He has normal  strength.  Strength lower extremities 5/5 and equal bilateral. Sensation intact. Normal gait.  Skin: Skin is warm and dry. No rash noted. He is not diaphoretic.  Psychiatric: He has a normal mood and affect. His behavior is normal.  Nursing note and vitals reviewed.   ED Course  Procedures (including critical care time) Labs Review Labs Reviewed - No data to display  Imaging Review No results found.   EKG Interpretation None      MDM   Final diagnoses:  Bilateral low back pain with left-sided sciatica  Low back strain, initial encounter   Pt in NAD. No tachycardia on my exam. No red flags concerning patient's back pain. No s/s of central cord compression or cauda equina. Lower extremities are neurovascularly intact and patient is able to ambulate. He is requesting something for pain, however he drove himself to the emergency department. No specific focal tenderness of his back. I do not feel imaging is necessary at this time. Advised rest, ice/heat, NSAIDs. Stable for discharge. Return precautions given. Patient states understanding of treatment care plan.  Kathrynn SpeedRobyn M Curvin Hunger, PA-C 07/30/14 2053  Toy CookeyMegan Docherty, MD 07/31/14 [id number]

## 2014-07-30 NOTE — Discharge Instructions (Signed)
Take naproxen as prescribed. Rest, avoid heavy lifting and hard physical activity for the next few days. Apply ice and heat to her back intermittently throughout the day.  Back Pain, Adult Low back pain is very common. About 1 in 5 people have back pain.The cause of low back pain is rarely dangerous. The pain often gets better over time.About half of people with a sudden onset of back pain feel better in just 2 weeks. About 8 in 10 people feel better by 6 weeks.  CAUSES Some common causes of back pain include:  Strain of the muscles or ligaments supporting the spine.  Wear and tear (degeneration) of the spinal discs.  Arthritis.  Direct injury to the back. DIAGNOSIS Most of the time, the direct cause of low back pain is not known.However, back pain can be treated effectively even when the exact cause of the pain is unknown.Answering your caregiver's questions about your overall health and symptoms is one of the most accurate ways to make sure the cause of your pain is not dangerous. If your caregiver needs more information, he or she may order lab work or imaging tests (X-rays or MRIs).However, even if imaging tests show changes in your back, this usually does not require surgery. HOME CARE INSTRUCTIONS For many people, back pain returns.Since low back pain is rarely dangerous, it is often a condition that people can learn to [facility] Incmanageon their own.   Remain active. It is stressful on the back to sit or stand in one place. Do not sit, drive, or stand in one place for more than 30 minutes at a time. Take short walks on level surfaces as soon as pain allows.Try to increase the length of time you walk each day.  Do not stay in bed.Resting more than 1 or 2 days can delay your recovery.  Do not avoid exercise or work.Your body is made to move.It is not dangerous to be active, even though your back may hurt.Your back will likely heal faster if you return to being active before your pain is  gone.  Pay attention to your body when you bend and lift. Many people have less discomfortwhen lifting if they bend their knees, keep the load close to their bodies,and avoid twisting. Often, the most comfortable positions are those that put less stress on your recovering back.  Find a comfortable position to sleep. Use a firm mattress and lie on your side with your knees slightly bent. If you lie on your back, put a pillow under your knees.  Only take over-the-counter or prescription medicines as directed by your caregiver. Over-the-counter medicines to reduce pain and inflammation are often the most helpful.Your caregiver may prescribe muscle relaxant drugs.These medicines help dull your pain so you can more quickly return to your normal activities and healthy exercise.  Put ice on the injured area.  Put ice in a plastic bag.  Place a towel between your skin and the bag.  Leave the ice on for 15-20 minutes, 03-04 times a day for the first 2 to 3 days. After that, ice and heat may be alternated to reduce pain and spasms.  Ask your caregiver about trying back exercises and gentle massage. This may be of some benefit.  Avoid feeling anxious or stressed.Stress increases muscle tension and can worsen back pain.It is important to recognize when you are anxious or stressed and learn ways to manage it.Exercise is a great option. SEEK MEDICAL CARE IF:  You have pain that is not  relieved with rest or medicine.  You have pain that does not improve in 1 week.  You have new symptoms.  You are generally not feeling well. SEEK IMMEDIATE MEDICAL CARE IF:   You have pain that radiates from your back into your legs.  You develop new bowel or bladder control problems.  You have unusual weakness or numbness in your arms or legs.  You develop nausea or vomiting.  You develop abdominal pain.  You feel faint. Document Released: 08/28/2005 Document Revised: 02/27/2012 Document Reviewed:  12/30/2013 [facility] Patient Information 2015 [place], [place]. This information is not intended to replace advice given to you by your health care provider. Make sure you discuss any questions you have with your health care provider.   Lumbosacral Strain Lumbosacral strain is a strain of any of the parts that make up your lumbosacral vertebrae. Your lumbosacral vertebrae are the bones that make up the lower third of your backbone. Your lumbosacral vertebrae are held together by muscles and tough, fibrous tissue (ligaments).  CAUSES  A sudden blow to your back can cause lumbosacral strain. Also, anything that causes an excessive stretch of the muscles in the low back can cause this strain. This is typically seen when people exert themselves strenuously, fall, lift heavy objects, bend, or crouch repeatedly. RISK FACTORS  Physically demanding work.  Participation in pushing or pulling sports or sports that require a sudden twist of the back (tennis, golf, baseball).  Weight lifting.  Excessive lower back curvature.  Forward-tilted pelvis.  Weak back or abdominal muscles or both.  Tight hamstrings. SIGNS AND SYMPTOMS  Lumbosacral strain may cause pain in the area of your injury or pain that moves (radiates) down your leg.  DIAGNOSIS Your health care provider can often diagnose lumbosacral strain through a physical exam. In some cases, you may need tests such as X-ray exams.  TREATMENT  Treatment for your lower back injury depends on many factors that your clinician will have to evaluate. However, most treatment will include the use of anti-inflammatory medicines. HOME CARE INSTRUCTIONS   Avoid hard physical activities (tennis, racquetball, waterskiing) if you are not in proper physical condition for it. This may aggravate or create problems.  If you have a back problem, avoid sports requiring sudden body movements. Swimming and walking are generally safer activities.  Maintain good  posture.  Maintain a healthy weight.  For acute conditions, you may put ice on the injured area.  Put ice in a plastic bag.  Place a towel between your skin and the bag.  Leave the ice on for 20 minutes, 2-3 times a day.  When the low back starts healing, stretching and strengthening exercises may be recommended. SEEK MEDICAL CARE IF:  Your back pain is getting worse.  You experience severe back pain not relieved with medicines. SEEK IMMEDIATE MEDICAL CARE IF:   You have numbness, tingling, weakness, or problems with the use of your arms or legs.  There is a change in bowel or bladder control.  You have increasing pain in any area of the body, including your belly (abdomen).  You notice shortness of breath, dizziness, or feel faint.  You feel sick to your stomach (nauseous), are throwing up (vomiting), or become sweaty.  You notice discoloration of your toes or legs, or your feet get very cold. MAKE SURE YOU:   Understand these instructions.  Will watch your condition.  Will get help right away if you are not doing well or get worse. Document  Released: 06/07/2005 Document Revised: 09/02/2013 Document Reviewed: 04/16/2013 [facility] Patient Information 2015 [place], [place]. This information is not intended to replace advice given to you by your health care provider. Make sure you discuss any questions you have with your health care provider.  Sciatica Sciatica is pain, weakness, numbness, or tingling along the path of the sciatic nerve. The nerve starts in the lower back and runs down the back of each leg. The nerve controls the muscles in the lower leg and in the back of the knee, while also providing sensation to the back of the thigh, lower leg, and the sole of your foot. Sciatica is a symptom of another medical condition. For instance, nerve damage or certain conditions, such as a herniated disk or bone spur on the spine, pinch or put pressure on the sciatic nerve. This  causes the pain, weakness, or other sensations normally associated with sciatica. Generally, sciatica only affects one side of the body. CAUSES   Herniated or slipped disc.  Degenerative disk disease.  A pain disorder involving the narrow muscle in the buttocks (piriformis syndrome).  Pelvic injury or fracture.  Pregnancy.  Tumor (rare). SYMPTOMS  Symptoms can vary from mild to very severe. The symptoms usually travel from the low back to the buttocks and down the back of the leg. Symptoms can include:  Mild tingling or dull aches in the lower back, leg, or hip.  Numbness in the back of the calf or sole of the foot.  Burning sensations in the lower back, leg, or hip.  Sharp pains in the lower back, leg, or hip.  Leg weakness.  Severe back pain inhibiting movement. These symptoms may get worse with coughing, sneezing, laughing, or prolonged sitting or standing. Also, being overweight may worsen symptoms. DIAGNOSIS  Your caregiver will perform a physical exam to look for common symptoms of sciatica. He or she may ask you to do certain movements or activities that would trigger sciatic nerve pain. Other tests may be performed to find the cause of the sciatica. These may include:  Blood tests.  X-rays.  Imaging tests, such as an MRI or CT scan. TREATMENT  Treatment is directed at the cause of the sciatic pain. Sometimes, treatment is not necessary and the pain and discomfort goes away on its own. If treatment is needed, your caregiver may suggest:  Over-the-counter medicines to relieve pain.  Prescription medicines, such as anti-inflammatory medicine, muscle relaxants, or narcotics.  Applying heat or ice to the painful area.  Steroid injections to lessen pain, irritation, and inflammation around the nerve.  Reducing activity during periods of pain.  Exercising and stretching to strengthen your abdomen and improve flexibility of your spine. Your caregiver may suggest  losing weight if the extra weight makes the back pain worse.  Physical therapy.  Surgery to eliminate what is pressing or pinching the nerve, such as a bone spur or part of a herniated disk. HOME CARE INSTRUCTIONS   Only take over-the-counter or prescription medicines for pain or discomfort as directed by your caregiver.  Apply ice to the affected area for 20 minutes, 3-4 times a day for the first 48-72 hours. Then try heat in the same way.  Exercise, stretch, or perform your usual activities if these do not aggravate your pain.  Attend physical therapy sessions as directed by your caregiver.  Keep all follow-up appointments as directed by your caregiver.  Do not wear high heels or shoes that do not provide proper support.  Check  your mattress to see if it is too soft. A firm mattress may lessen your pain and discomfort. SEEK IMMEDIATE MEDICAL CARE IF:   You lose control of your bowel or bladder (incontinence).  You have increasing weakness in the lower back, pelvis, buttocks, or legs.  You have redness or swelling of your back.  You have a burning sensation when you urinate.  You have pain that gets worse when you lie down or awakens you at night.  Your pain is worse than you have experienced in the past.  Your pain is lasting longer than 4 weeks.  You are suddenly losing weight without reason. MAKE SURE YOU:  Understand these instructions.  Will watch your condition.  Will get help right away if you are not doing well or get worse. Document Released: 08/22/2001 Document Revised: 02/27/2012 Document Reviewed: 01/07/2012 [facility] Patient Information 2015 [place], [place]. This information is not intended to replace advice given to you by your health care provider. Make sure you discuss any questions you have with your health care provider.

## 2014-09-15 ENCOUNTER — Encounter (HOSPITAL_COMMUNITY): Payer: Self-pay | Admitting: Emergency Medicine

## 2014-09-15 ENCOUNTER — Emergency Department (HOSPITAL_COMMUNITY)
Admission: EM | Admit: 2014-09-15 | Discharge: 2014-09-15 | Disposition: A | Discharge status: Home / Self Care | Payer: BLUE CROSS/BLUE SHIELD | Attending: Emergency Medicine | Admitting: Emergency Medicine

## 2014-09-15 ENCOUNTER — Emergency Department (HOSPITAL_COMMUNITY): Payer: BLUE CROSS/BLUE SHIELD

## 2014-09-15 DIAGNOSIS — Z720 Tobacco use: Secondary | ICD-10-CM | POA: Diagnosis not present

## 2014-09-15 DIAGNOSIS — Z79899 Other long term (current) drug therapy: Secondary | ICD-10-CM | POA: Diagnosis not present

## 2014-09-15 DIAGNOSIS — Z791 Long term (current) use of non-steroidal anti-inflammatories (NSAID): Secondary | ICD-10-CM | POA: Insufficient documentation

## 2014-09-15 DIAGNOSIS — J069 Acute upper respiratory infection, unspecified: Secondary | ICD-10-CM

## 2014-09-15 DIAGNOSIS — R05 Cough: Secondary | ICD-10-CM

## 2014-09-15 DIAGNOSIS — G4700 Insomnia, unspecified: Secondary | ICD-10-CM | POA: Insufficient documentation

## 2014-09-15 DIAGNOSIS — F419 Anxiety disorder, unspecified: Secondary | ICD-10-CM | POA: Diagnosis not present

## 2014-09-15 DIAGNOSIS — Z8639 Personal history of other endocrine, nutritional and metabolic disease: Secondary | ICD-10-CM | POA: Insufficient documentation

## 2014-09-15 DIAGNOSIS — J45901 Unspecified asthma with (acute) exacerbation: Secondary | ICD-10-CM | POA: Insufficient documentation

## 2014-09-15 DIAGNOSIS — K219 Gastro-esophageal reflux disease without esophagitis: Secondary | ICD-10-CM | POA: Diagnosis not present

## 2014-09-15 DIAGNOSIS — R059 Cough, unspecified: Secondary | ICD-10-CM

## 2014-09-15 MED ORDER — ALBUTEROL SULFATE (2.5 MG/3ML) 0.083% IN NEBU
5.0000 mg | INHALATION_SOLUTION | Freq: Once | RESPIRATORY_TRACT | Status: AC
  Administered 2014-09-15: 5 mg via RESPIRATORY_TRACT
  Filled 2014-09-15: qty 6

## 2014-09-15 MED ORDER — HYDROCOD POLST-CHLORPHEN POLST 10-8 MG/5ML PO LQCR: 5.0000 mL | Freq: Two times a day (BID) | ORAL | Status: DC

## 2014-09-15 MED ORDER — PREDNISONE 20 MG PO TABS: 40.0000 mg | ORAL_TABLET | Freq: Every day | ORAL | Status: DC

## 2014-09-15 MED ORDER — SALINE SPRAY 0.65 % NA SOLN: 1.0000 | NASAL | Status: DC | PRN

## 2014-09-15 MED ORDER — PREDNISONE 20 MG PO TABS
60.0000 mg | ORAL_TABLET | Freq: Once | ORAL | Status: AC
  Administered 2014-09-15: 60 mg via ORAL
  Filled 2014-09-15: qty 3

## 2014-09-15 NOTE — ED Notes (Signed)
Pt reports runny nose, post nasal drip, coughing up thick brown mucous. Pt has COPD and asthma. Wheezing noted. Pt states his "right lung hurts." Pt has been taking OTC cough medicine with no relief.

## 2014-09-15 NOTE — Discharge Instructions (Signed)
Follow-up with your primary care doctor. Return to the ER with any shortness of breath, respiratory distress, high fever, worsening of symptoms.   Upper Respiratory Infection, Adult An upper respiratory infection (URI) is also sometimes known as the common cold. The upper respiratory tract includes the nose, sinuses, throat, trachea, and bronchi. Bronchi are the airways leading to the lungs. Most people improve within 1 week, but symptoms can last up to 2 weeks. A residual cough may last even longer.  CAUSES Many different viruses can infect the tissues lining the upper respiratory tract. The tissues become irritated and inflamed and often become very moist. Mucus production is also common. A cold is contagious. You can easily spread the virus to others by oral contact. This includes kissing, sharing a glass, coughing, or sneezing. Touching your mouth or nose and then touching a surface, which is then touched by another person, can also spread the virus. SYMPTOMS  Symptoms typically develop 1 to 3 days after you come in contact with a cold virus. Symptoms vary from person to person. They may include:  Runny nose.  Sneezing.  Nasal congestion.  Sinus irritation.  Sore throat.  Loss of voice (laryngitis).  Cough.  Fatigue.  Muscle aches.  Loss of appetite.  Headache.  Low-grade fever. DIAGNOSIS  You might diagnose your own cold based on familiar symptoms, since most people get a cold 2 to 3 times a year. Your caregiver can confirm this based on your exam. Most importantly, your caregiver can check that your symptoms are not due to another disease such as strep throat, sinusitis, pneumonia, asthma, or epiglottitis. Blood tests, throat tests, and X-rays are not necessary to diagnose a common cold, but they may sometimes be helpful in excluding other more serious diseases. Your caregiver will decide if any further tests are required. RISKS AND COMPLICATIONS  You may be at risk for a  more severe case of the common cold if you smoke cigarettes, have chronic heart disease (such as heart failure) or lung disease (such as asthma), or if you have a weakened immune system. The very young and very old are also at risk for more serious infections. Bacterial sinusitis, middle ear infections, and bacterial pneumonia can complicate the common cold. The common cold can worsen asthma and chronic obstructive pulmonary disease (COPD). Sometimes, these complications can require emergency medical care and may be life-threatening. PREVENTION  The best way to protect against getting a cold is to practice good hygiene. Avoid oral or hand contact with people with cold symptoms. Wash your hands often if contact occurs. There is no clear evidence that vitamin C, vitamin E, echinacea, or exercise reduces the chance of developing a cold. However, it is always recommended to get plenty of rest and practice good nutrition. TREATMENT  Treatment is directed at relieving symptoms. There is no cure. Antibiotics are not effective, because the infection is caused by a virus, not by bacteria. Treatment may include:  Increased fluid intake. Sports drinks offer valuable electrolytes, sugars, and fluids.  Breathing heated mist or steam (vaporizer or shower).  Eating chicken soup or other clear broths, and maintaining good nutrition.  Getting plenty of rest.  Using gargles or lozenges for comfort.  Controlling fevers with ibuprofen or acetaminophen as directed by your caregiver.  Increasing usage of your inhaler if you have asthma. Zinc gel and zinc lozenges, taken in the first 24 hours of the common cold, can shorten the duration and lessen the severity of symptoms. Pain medicines  may help with fever, muscle aches, and throat pain. A variety of non-prescription medicines are available to treat congestion and runny nose. Your caregiver can make recommendations and may suggest nasal or lung inhalers for other  symptoms.  HOME CARE INSTRUCTIONS   Only take over-the-counter or prescription medicines for pain, discomfort, or fever as directed by your caregiver.  Use a warm mist humidifier or inhale steam from a shower to increase air moisture. This may keep secretions moist and make it easier to breathe.  Drink enough water and fluids to keep your urine clear or pale yellow.  Rest as needed.  Return to work when your temperature has returned to normal or as your caregiver advises. You may need to stay home longer to avoid infecting others. You can also use a face mask and careful hand washing to prevent spread of the virus. SEEK MEDICAL CARE IF:   After the first few days, you feel you are getting worse rather than better.  You need your caregiver's advice about medicines to control symptoms.  You develop chills, worsening shortness of breath, or brown or red sputum. These may be signs of pneumonia.  You develop yellow or brown nasal discharge or pain in the face, especially when you bend forward. These may be signs of sinusitis.  You develop a fever, swollen neck glands, pain with swallowing, or white areas in the back of your throat. These may be signs of strep throat. SEEK IMMEDIATE MEDICAL CARE IF:   You have a fever.  You develop severe or persistent headache, ear pain, sinus pain, or chest pain.  You develop wheezing, a prolonged cough, cough up blood, or have a change in your usual mucus (if you have chronic lung disease).  You develop sore muscles or a stiff neck. Document Released: 02/21/2001 Document Revised: 11/20/2011 Document Reviewed: 12/03/2013 [facility] Patient Information 2015 [place], [place]. This information is not intended to replace advice given to you by your health care provider. Make sure you discuss any questions you have with your health care provider.

## 2014-09-15 NOTE — ED Notes (Signed)
Pt reports cough, chest and sinus congestion x 1 week. Also c/o body aches. Hx asthma- exp wheezes noted.

## 2014-09-15 NOTE — ED Provider Notes (Signed)
CSN: [id number]     Arrival date & time 09/15/14  1910 History  This chart was scribed for non-physician practitioner, Jinny SandersJoseph Yuya Vanwingerden, PA-C, working with Benny LennertJoseph L Zammit, MD, by Bronson CurbJacqueline Melvin, ED Scribe. This patient was seen in room TR05C/TR05C and the patient's care was started at 8:33 PM.   Chief Complaint  Patient presents with  . Cough    The history is provided by the patient. No language interpreter was used.     HPI Comments: Eddie Gross is a 39 y.o. male who presents to the Emergency Department complaining of persistent, productive cough of thick, brown sputum for the past week. Patient states he is having difficulty "breathing with right lung" and suspects he has bronchitis and sinusitis. There is associated chills, wheezing, SOB, sinus pressure, occasional otalgia, nasal congestion, and chest tightness. He reports history of COPD, pleurisy,and asthma and is reporting having similar symptoms tonight. Patient has tried using Sudafed, Cheratussin, Tylenol, ibuprofen, and several other OTC medication without relief. He denies fever.  Past Medical History  Diagnosis Date  . Anxiety   . Asthma   . GERD (gastroesophageal reflux disease)   . Allergy     RHINITIS  . Hyperlipidemia   . Pre-diabetes   . Acid reflux   . Insomnia    History reviewed. No pertinent past surgical history. Family History  Problem Relation Age of Onset  . Heart disease Father     CORNARY HEART DISEASE/ 2 BYPASS SURGERIES  . Cancer Father     PROSTATE  . Heart disease Paternal Grandfather     CORNARY HEART DISEASE  . Arthritis Paternal Grandfather    History  Substance Use Topics  . Smoking status: Current Every Day Smoker -- 1.00 packs/day    Types: Cigarettes    Last Attempt to Quit: 12/29/2012  . Smokeless tobacco: Former NeurosurgeonUser    Quit date: 12/29/2012  . Alcohol Use: No    Review of Systems  Constitutional: Positive for chills. Negative for fever.  HENT: Positive for congestion, ear pain  and sinus pressure. Negative for sore throat.   Respiratory: Positive for cough, chest tightness, shortness of breath and wheezing.       Allergies  Review of patient's allergies indicates no known allergies.  Home Medications   Prior to Admission medications   Medication Sig Start Date End Date Taking? Authorizing Provider  albuterol (PROVENTIL HFA;VENTOLIN HFA) 108 (90 BASE) MCG/ACT inhaler Inhale 4 puffs into the lungs every 6 (six) hours as needed for wheezing.    Historical Provider, MD  chlorpheniramine-HYDROcodone (TUSSIONEX PENNKINETIC ER) 10-8 MG/5ML LQCR Take 5 mLs by mouth 2 (two) times daily. 09/15/14   Monte FantasiaJoseph W Renlee Floor, PA-C  Cyanocobalamin (VITAMIN B-12 PO) Take 1 tablet by mouth daily.    Historical Provider, MD  fluticasone (FLONASE) 50 MCG/ACT nasal spray Place 1 spray into both nostrils daily as needed for allergies or rhinitis.    Historical Provider, MD  ibuprofen (ADVIL,MOTRIN) 200 MG tablet Take 200-600 mg by mouth every 6 (six) hours as needed. For pain relief    Historical Provider, MD  LORazepam (ATIVAN) 1 MG tablet Take 1 mg by mouth at bedtime.    Historical Provider, MD  naproxen (NAPROSYN) 500 MG tablet Take 1 tablet (500 mg total) by mouth 2 (two) times daily. 07/30/14   Robyn M Hess, PA-C  predniSONE (DELTASONE) 20 MG tablet Take 2 tablets (40 mg total) by mouth daily. 09/15/14   Monte FantasiaJoseph W Jamario Colina, PA-C  ranitidine Orpha Bur(ZANTAC)  150 MG tablet Take 150 mg by mouth daily as needed for heartburn.     Historical Provider, MD  sodium chloride (OCEAN) 0.65 % SOLN nasal spray Place 1 spray into both nostrils as needed for congestion. 09/15/14   Monte Fantasia, PA-C   Triage Vitals: BP 142/96 mmHg  Temp(Src) 98 F (36.7 C) (Oral)  Resp 18  SpO2 97%  Physical Exam  Constitutional: He is oriented to person, place, and time. He appears well-developed and well-nourished. No distress.  HENT:  Head: Normocephalic and atraumatic.  Right Ear: Tympanic membrane, external ear and ear  canal normal.  Left Ear: Tympanic membrane, external ear and ear canal normal.  Nose: Mucosal edema present.  Mouth/Throat: Posterior oropharyngeal erythema present. No oropharyngeal exudate or posterior oropharyngeal edema.  TMs clear bilaterally.  Nasal turbinates are mildly edematous with mild erythema.  Mild erythema to postoropharynx without tonsillar swelling or exudate.  Eyes: Conjunctivae and EOM are normal.  Neck: Neck supple. No tracheal deviation present.  Cardiovascular: Normal rate.   Pulmonary/Chest: Effort normal. No respiratory distress. He has wheezes.  Mild expiratory wheezes heard bilaterally, right worse than left.  Musculoskeletal: Normal range of motion.  Neurological: He is alert and oriented to person, place, and time.  Skin: Skin is warm and dry.  Psychiatric: He has a normal mood and affect. His behavior is normal.  Nursing note and vitals reviewed.   ED Course  Procedures (including critical care time)  DIAGNOSTIC STUDIES: Oxygen Saturation is 97% on room air, adequate by my interpretation.    COORDINATION OF CARE: At 2038 Discussed treatment plan with patient which includes CXR and breathing treatment. Patient agrees.   Labs Review Labs Reviewed - No data to display  Imaging Review Dg Chest 2 View  09/15/2014   CLINICAL DATA:  Right-sided chest pain and shortness of breath for 1 week. Cough.  EXAM: CHEST  2 VIEW  COMPARISON:  06/25/2014  FINDINGS: The heart size and mediastinal contours are within normal limits. Both lungs are clear. The visualized skeletal structures are unremarkable.  IMPRESSION: No active cardiopulmonary disease.   Electronically Signed   By: Burman Nieves M.D.   On: 09/15/2014 21:30     EKG Interpretation None     Patient's heart rate was 80 on initial exam-- no initial HR recorded in triage.  MDM   Final diagnoses:  Cough  URI (upper respiratory infection)   Patient here with cough, nasal congestion, wheezing. X-rays  unremarkable for acute pathology or pneumonia. On reexamination of patient's lungs they are clear equally and bilaterally. Patient describing his tightness in his right lung had improved after nebulizer treatment. No concern for ACS based on pain increasing with respiration and with cough, and the fact the patient had negative ACS workup 2 months ago for similar complaints. PERC negative.  Tightness most likely due to mild bronchospasm. Patient reporting improvement of symptoms, well-appearing, non-tachycardic, nontachypneic, non-hypoxic and in no acute distress. We'll discharge patient with diagnosis of upper respiratory infection. I strongly encouraged patient to follow up with his primary care provider regarding this visit and his symptoms, to insure resolution of symptoms. I discussed return precautions with patient and encourage him to call or return to the ER should he have any worsening of symptoms or should he have any questions or concerns.  BP 132/83 mmHg  Pulse 91  Temp(Src) 97.6 F (36.4 C) (Oral)  Resp 19  SpO2 98%  Signed,  Ladona Mow, PA-C 2:54 AM  Monte Fantasia, PA-C 09/16/14 1610  Benny Lennert, MD 09/18/14 [phone]

## 2015-01-18 ENCOUNTER — Ambulatory Visit (HOSPITAL_COMMUNITY): Payer: BLUE CROSS/BLUE SHIELD | Admitting: Psychiatry

## 2015-03-22 ENCOUNTER — Encounter (HOSPITAL_COMMUNITY): Payer: Self-pay | Admitting: Emergency Medicine

## 2015-03-22 ENCOUNTER — Emergency Department (HOSPITAL_COMMUNITY): Payer: BLUE CROSS/BLUE SHIELD

## 2015-03-22 ENCOUNTER — Emergency Department (HOSPITAL_COMMUNITY)
Admission: EM | Admit: 2015-03-22 | Discharge: 2015-03-23 | Disposition: A | Discharge status: Home / Self Care | Payer: BLUE CROSS/BLUE SHIELD | Attending: Emergency Medicine | Admitting: Emergency Medicine

## 2015-03-22 DIAGNOSIS — G4700 Insomnia, unspecified: Secondary | ICD-10-CM | POA: Insufficient documentation

## 2015-03-22 DIAGNOSIS — R110 Nausea: Secondary | ICD-10-CM | POA: Diagnosis not present

## 2015-03-22 DIAGNOSIS — Z8639 Personal history of other endocrine, nutritional and metabolic disease: Secondary | ICD-10-CM | POA: Insufficient documentation

## 2015-03-22 DIAGNOSIS — K088 Other specified disorders of teeth and supporting structures: Secondary | ICD-10-CM | POA: Diagnosis present

## 2015-03-22 DIAGNOSIS — F419 Anxiety disorder, unspecified: Secondary | ICD-10-CM | POA: Insufficient documentation

## 2015-03-22 DIAGNOSIS — Z720 Tobacco use: Secondary | ICD-10-CM | POA: Insufficient documentation

## 2015-03-22 DIAGNOSIS — K219 Gastro-esophageal reflux disease without esophagitis: Secondary | ICD-10-CM | POA: Diagnosis not present

## 2015-03-22 DIAGNOSIS — K029 Dental caries, unspecified: Secondary | ICD-10-CM | POA: Diagnosis not present

## 2015-03-22 DIAGNOSIS — R079 Chest pain, unspecified: Secondary | ICD-10-CM

## 2015-03-22 DIAGNOSIS — J45909 Unspecified asthma, uncomplicated: Secondary | ICD-10-CM | POA: Diagnosis not present

## 2015-03-22 DIAGNOSIS — Z79899 Other long term (current) drug therapy: Secondary | ICD-10-CM | POA: Insufficient documentation

## 2015-03-22 DIAGNOSIS — M25512 Pain in left shoulder: Secondary | ICD-10-CM | POA: Insufficient documentation

## 2015-03-22 DIAGNOSIS — Z7951 Long term (current) use of inhaled steroids: Secondary | ICD-10-CM | POA: Diagnosis not present

## 2015-03-22 LAB — COMPREHENSIVE METABOLIC PANEL
ALBUMIN: 4 g/dL (ref 3.5–5.0)
ALK PHOS: 72 U/L (ref 38–126)
ALT: 29 U/L (ref 17–63)
ANION GAP: 8 (ref 5–15)
AST: 23 U/L (ref 15–41)
BILIRUBIN TOTAL: 0.9 mg/dL (ref 0.3–1.2)
BUN: 9 mg/dL (ref 6–20)
CALCIUM: 9 mg/dL (ref 8.9–10.3)
CO2: 27 mmol/L (ref 22–32)
Chloride: 102 mmol/L (ref 101–111)
Creatinine, Ser: 1.17 mg/dL (ref 0.61–1.24)
GLUCOSE: 90 mg/dL (ref 65–99)
Potassium: 3.9 mmol/L (ref 3.5–5.1)
Sodium: 137 mmol/L (ref 135–145)
TOTAL PROTEIN: 6.6 g/dL (ref 6.5–8.1)

## 2015-03-22 LAB — CBC WITH DIFFERENTIAL/PLATELET
BASOS PCT: 1 % (ref 0–1)
Basophils Absolute: 0.1 10*3/uL (ref 0.0–0.1)
EOS ABS: 1.2 10*3/uL — AB (ref 0.0–0.7)
Eosinophils Relative: 9 % — ABNORMAL HIGH (ref 0–5)
HEMATOCRIT: 42.8 % (ref 39.0–52.0)
Hemoglobin: 15.5 g/dL (ref 13.0–17.0)
Lymphocytes Relative: 25 % (ref 12–46)
Lymphs Abs: 3.1 10*3/uL (ref 0.7–4.0)
MCH: 33.8 pg (ref 26.0–34.0)
MCHC: 36.2 g/dL — AB (ref 30.0–36.0)
MCV: 93.2 fL (ref 78.0–100.0)
Monocytes Absolute: 0.9 10*3/uL (ref 0.1–1.0)
Monocytes Relative: 7 % (ref 3–12)
NEUTROS ABS: 7.2 10*3/uL (ref 1.7–7.7)
Neutrophils Relative %: 58 % (ref 43–77)
Platelets: 194 10*3/uL (ref 150–400)
RBC: 4.59 MIL/uL (ref 4.22–5.81)
RDW: 13.2 % (ref 11.5–15.5)
WBC: 12.4 10*3/uL — ABNORMAL HIGH (ref 4.0–10.5)

## 2015-03-22 LAB — I-STAT TROPONIN, ED: Troponin i, poc: 0 ng/mL (ref 0.00–0.08)

## 2015-03-22 LAB — D-DIMER, QUANTITATIVE: D-Dimer, Quant: <0.27 ug/mL-FEU (ref 0.00–0.48)

## 2015-03-22 MED ORDER — HYDROCODONE-ACETAMINOPHEN 5-325 MG PO TABS
2.0000 | ORAL_TABLET | Freq: Once | ORAL | Status: AC
  Administered 2015-03-22: 2 via ORAL
  Filled 2015-03-22: qty 2

## 2015-03-22 MED ORDER — ASPIRIN 81 MG PO CHEW
324.0000 mg | CHEWABLE_TABLET | Freq: Once | ORAL | Status: AC
  Administered 2015-03-23: 324 mg via ORAL
  Filled 2015-03-22: qty 4

## 2015-03-22 NOTE — ED Notes (Signed)
Pt. reports left lower molar pain onset today and left shoulder joint pain for 2 weeks , pain increases with movement .

## 2015-03-22 NOTE — ED Provider Notes (Signed)
CSN: [id number]     Arrival date & time 03/22/15  2131 History  This chart was scribed for Dierdre Forth, PA-C, working with Eber Hong, MD by Elon Spanner, ED Scribe. This patient was seen in room TR03C/TR03C and the patient's care was started at 10:35 PM.   Chief Complaint  Patient presents with  . Dental Pain  . Shoulder Pain  . Chest Pain   The history is provided by the patient. No language interpreter was used.   HPI Comments: Eddie Gross is a 39 y.o. male with a history of anxiety, asthma, HLD, GERD who presents to the Emergency Department complaining intermittent, squeezing, 6/10 pain in left shoulder onset 1 week ago with concurrent SOB, and intermittent radiation to the chest and down the left arm.  He also reports nausea today only, a cough the past several days, and increased use of his albuterol inhaler the past several days.  The episodes occur 7-8 times per day and last 10 minutes.  The patient reports the episodes typically occur while he is driving.  He works as a Naval architect.  He reports today's episode began around 8am and has been persistent since that time.  He denies history of DVT/PE.  Patient is a current smoker.  He denies leg swelling, fever, chills.   He also complains of left lower dental pain.   Past Medical History  Diagnosis Date  . Anxiety   . Asthma   . GERD (gastroesophageal reflux disease)   . Allergy     RHINITIS  . Hyperlipidemia   . Pre-diabetes   . Acid reflux   . Insomnia    History reviewed. No pertinent past surgical history. Family History  Problem Relation Age of Onset  . Heart disease Father     CORNARY HEART DISEASE/ 2 BYPASS SURGERIES  . Cancer Father     PROSTATE  . Heart disease Paternal Grandfather     CORNARY HEART DISEASE  . Arthritis Paternal Grandfather    History  Substance Use Topics  . Smoking status: Current Every Day Smoker -- 1.00 packs/day    Types: Cigarettes    Last Attempt to Quit: 12/29/2012  .  Smokeless tobacco: Former Neurosurgeon    Quit date: 12/29/2012  . Alcohol Use: No    Review of Systems  Constitutional: Negative for fever, chills, diaphoresis, appetite change, fatigue and unexpected weight change.  HENT: Positive for dental problem. Negative for mouth sores.   Eyes: Negative for visual disturbance.  Respiratory: Positive for cough. Negative for chest tightness, shortness of breath and wheezing.   Cardiovascular: Positive for chest pain.  Gastrointestinal: Positive for nausea. Negative for vomiting, abdominal pain, diarrhea and constipation.  Endocrine: Negative for polydipsia, polyphagia and polyuria.  Genitourinary: Negative for dysuria, urgency, frequency and hematuria.  Musculoskeletal: Positive for arthralgias. Negative for back pain and neck stiffness.  Skin: Negative for rash.  Allergic/Immunologic: Negative for immunocompromised state.  Neurological: Negative for syncope, light-headedness and headaches.  Hematological: Does not bruise/bleed easily.  Psychiatric/Behavioral: Negative for sleep disturbance. The patient is not nervous/anxious.       Allergies  Review of patient's allergies indicates no known allergies.  Home Medications   Prior to Admission medications   Medication Sig Start Date End Date Taking? Authorizing Provider  albuterol (PROVENTIL HFA;VENTOLIN HFA) 108 (90 BASE) MCG/ACT inhaler Inhale 4 puffs into the lungs every 6 (six) hours as needed for wheezing.   Yes Historical Provider, MD  Cyanocobalamin (VITAMIN B-12 PO) Take 1  tablet by mouth daily.   Yes Historical Provider, MD  fluticasone (FLONASE) 50 MCG/ACT nasal spray Place 1 spray into both nostrils daily as needed for allergies or rhinitis.   Yes Historical Provider, MD  ibuprofen (ADVIL,MOTRIN) 200 MG tablet Take 200-600 mg by mouth every 6 (six) hours as needed. For pain relief   Yes Historical Provider, MD  LORazepam (ATIVAN) 1 MG tablet Take 1 mg by mouth at bedtime.   Yes Historical  Provider, MD  ranitidine (ZANTAC) 150 MG tablet Take 150 mg by mouth daily as needed for heartburn.    Yes Historical Provider, MD  zaleplon (SONATA) 10 MG capsule Take 10 mg by mouth at bedtime as needed for sleep.  02/21/15  Yes Historical Provider, MD   BP 119/74 mmHg  Pulse 94  Temp(Src) 98 F (36.7 C) (Oral)  Resp 16  Ht 5\' 11"  (1.803 m)  Wt 220 lb (99.791 kg)  BMI 30.70 kg/m2  SpO2 97% Physical Exam  Constitutional: He appears well-developed and well-nourished. No distress.  Awake, alert, nontoxic appearance  HENT:  Head: Normocephalic and atraumatic.  Right Ear: Tympanic membrane, external ear and ear canal normal.  Left Ear: Tympanic membrane, external ear and ear canal normal.  Nose: Nose normal. Right sinus exhibits no maxillary sinus tenderness and no frontal sinus tenderness. Left sinus exhibits no maxillary sinus tenderness and no frontal sinus tenderness.  Mouth/Throat: Uvula is midline, oropharynx is clear and moist and mucous membranes are normal. No oral lesions. Abnormal dentition. Dental caries present. No uvula swelling or lacerations. No oropharyngeal exudate, posterior oropharyngeal edema, posterior oropharyngeal erythema or tonsillar abscesses.  No gingival swelling, fluctuance or induration No gross abscess TTP of tooth #17   Eyes: Conjunctivae are normal. Pupils are equal, round, and reactive to light. Right eye exhibits no discharge. Left eye exhibits no discharge. No scleral icterus.  Neck: Normal range of motion. Neck supple.  No stridor Handling secretions without difficulty No nuchal rigidity No cervical lymphadenopathy   Cardiovascular: Normal rate, regular rhythm, normal heart sounds and intact distal pulses.   Pulmonary/Chest: Effort normal and breath sounds normal. No respiratory distress. He has no wheezes.  Equal chest expansion  Abdominal: Soft. Bowel sounds are normal. He exhibits no distension and no mass. There is no tenderness. There is no  rebound and no guarding.  Musculoskeletal: Normal range of motion. He exhibits no edema.  No TTP of the left shoulder Full ROM of the left shoulder with minimal pain No deformity, erythema or increased warmth of the left shoulder  Lymphadenopathy:    He has no cervical adenopathy.  Neurological: He is alert.  Speech is clear and goal oriented Moves extremities without ataxia  Skin: Skin is warm and dry. He is not diaphoretic.  Psychiatric: He has a normal mood and affect.  Nursing note and vitals reviewed.   ED Course  Procedures (including critical care time)  DIAGNOSTIC STUDIES: Oxygen Saturation is 97% on RA, normal by my interpretation.    COORDINATION OF CARE:   Labs Review Labs Reviewed  CBC WITH DIFFERENTIAL/PLATELET - Abnormal; Notable for the following:    WBC 12.4 (*)    MCHC 36.2 (*)    Eosinophils Relative 9 (*)    Eosinophils Absolute 1.2 (*)    All other components within normal limits  COMPREHENSIVE METABOLIC PANEL  D-DIMER, QUANTITATIVE (NOT AT [facility])  I-STAT TROPOININ, ED    Imaging Review No results found.   EKG Interpretation None  MDM   Final diagnoses:  Chest pain  Pain due to dental caries   Glyn Ade Presents with complaints of left shoulder and chest pain. Chest pain workup begun.  Heart Score low (trop and ECG pending).  PERC negative.    Patient also with dental pain.  No gross abscess.  Exam unconcerning for Ludwig's angina or spread of infection.  Will treat with penicillin and pain medicine.  Urged patient to follow-up with dentist.    11:52 PM Case discussed with and care transferred to [facility], PA-C.  Dispo pending ECG and CXR.  Anticipate discharge home.    Dahlia Client Alyanah Elliott, PA-C 03/22/15 2355  Eber Hong, MD 03/23/15 [phone]

## 2015-03-23 MED ORDER — PENICILLIN V POTASSIUM 500 MG PO TABS: 500.0000 mg | ORAL_TABLET | Freq: Three times a day (TID) | ORAL | Status: DC

## 2015-03-23 MED ORDER — HYDROCODONE-ACETAMINOPHEN 5-325 MG PO TABS
ORAL_TABLET | ORAL | Status: DC
Start: 2015-03-23 — End: 2015-04-06

## 2015-03-23 MED ORDER — PENICILLIN V POTASSIUM 250 MG PO TABS
500.0000 mg | ORAL_TABLET | Freq: Once | ORAL | Status: AC
  Administered 2015-03-23: 500 mg via ORAL
  Filled 2015-03-23: qty 2

## 2015-03-23 MED ORDER — NAPROXEN 500 MG PO TABS: 500.0000 mg | ORAL_TABLET | Freq: Two times a day (BID) | ORAL | Status: DC

## 2015-03-23 NOTE — ED Notes (Signed)
Pt verbalizes understanding of d/c instructions and denies any further need at this time. 

## 2015-03-23 NOTE — ED Provider Notes (Signed)
12:11 AM Handoff from Muthersbaugh PA-C.   Patient presents with dental pain in the left lower jaw for 1 day. Patient also describes left shoulder pain is worse with movement. He reported shortness of breath and radiation to left chest starting at 8 AM today.   D-dimer was negative, chest x-ray is clear. EKG is unremarkable. Troponin 1 negative.  Will d/c to home with penicillin and vicodin. Encouraged dental follow-up.   Patient counseled on use of narcotic pain medications. Counseled not to combine these medications with others containing tylenol. Urged not to drink alcohol, drive, or perform any other activities that requires focus while taking these medications. The patient verbalizes understanding and agrees with the plan.  Patient counseled to take prescribed medications as directed, return with worsening facial or neck swelling, and to follow-up with their dentist as soon as possible.          ED ECG REPORT   Date: 03/23/2015  Rate: 84  Rhythm: normal sinus rhythm  QRS Axis: normal  Intervals: normal  ST/T Wave abnormalities: normal  Conduction Disutrbances:none  Narrative Interpretation:   Old EKG Reviewed: unchanged  I have personally reviewed the EKG tracing and agree with the computerized printout as noted.   Renne CriglerJoshua Devian Bartolomei, PA-C 03/23/15 0107  Eber HongBrian Miller, MD 03/23/15 [phone]

## 2015-03-23 NOTE — Discharge Instructions (Signed)
Please read and follow all provided instructions.  Your diagnoses today include:  1. Pain due to dental caries   2. Chest pain    Tests performed today include:  An EKG of your heart  A chest x-ray  Cardiac enzymes - a blood test for heart muscle damage  Blood counts and electrolytes  Vital signs. See below for your results today.   Medications prescribed:   Penicillin - antibiotic  You have been prescribed an antibiotic medicine: take the entire course of medicine even if you are feeling better. Stopping early can cause the antibiotic not to work.   Vicodin (hydrocodone/acetaminophen) - narcotic pain medication  DO NOT drive or perform any activities that require you to be awake and alert because this medicine can make you drowsy. BE VERY CAREFUL not to take multiple medicines containing Tylenol (also called acetaminophen). Doing so can lead to an overdose which can damage your liver and cause liver failure and possibly death.   Naproxen - anti-inflammatory pain medication  Do not exceed  naproxen every 12 hours, take with food  You have been prescribed an anti-inflammatory medication or NSAID. Take with food. Take smallest effective dose for the shortest duration needed for your pain. Stop taking if you experience stomach pain or vomiting.   Take any prescribed medications only as directed.  Follow-up instructions: Please follow-up with your primary care provider as soon as you can for further evaluation of your symptoms.   Return instructions:  SEEK IMMEDIATE MEDICAL ATTENTION IF:  You have severe chest pain, especially if the pain is crushing or pressure-like and spreads to the arms, back, neck, or jaw, or if you have sweating, nausea (feeling sick to your stomach), or shortness of breath. THIS IS AN EMERGENCY. Don't wait to see if the pain will go away. Get medical help at once. Call 911 or 0 (operator). DO NOT drive yourself to the hospital.   Your chest pain  gets worse and does not go away with rest.   You have an attack of chest pain lasting longer than usual, despite rest and treatment with the medications your caregiver has prescribed.   You wake from sleep with chest pain or shortness of breath.  You feel dizzy or faint.  You have chest pain not typical of your usual pain for which you originally saw your caregiver.   You have any other emergent concerns regarding your health.  Additional Information: Chest pain comes from many different causes. Your caregiver has diagnosed you as having chest pain that is not specific for one problem, but does not require admission.  You are at low risk for an acute heart condition or other serious illness.   Your vital signs today were: BP 117/82 mmHg   Pulse 91   Temp(Src) 98 F (36.7 C) (Oral)   Resp 18   Ht  (1.803 m)   Wt 220 lb (99.791 kg)   BMI 30.70 kg/m2   SpO2 97% If your blood pressure (BP) was elevated above 135/85 this visit, please have this repeated by your doctor within one month. --------------

## 2015-04-05 ENCOUNTER — Ambulatory Visit: Payer: BLUE CROSS/BLUE SHIELD

## 2015-04-06 ENCOUNTER — Ambulatory Visit (INDEPENDENT_AMBULATORY_CARE_PROVIDER_SITE_OTHER): Payer: BLUE CROSS/BLUE SHIELD | Admitting: Emergency Medicine

## 2015-04-06 VITALS — BP 110/78 | HR 97 | Temp 98.3°F | Resp 16 | Ht 70.25 in | Wt 231.0 lb

## 2015-04-06 DIAGNOSIS — M26629 Arthralgia of temporomandibular joint, unspecified side: Secondary | ICD-10-CM

## 2015-04-06 DIAGNOSIS — M2662 Arthralgia of temporomandibular joint: Secondary | ICD-10-CM | POA: Diagnosis not present
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
## 2015-04-06 NOTE — Patient Instructions (Signed)
Temporomandibular Problems  Temporomandibular joint (TMJ) dysfunction means there are problems with the joint between your jaw and your skull. This is a joint lined by cartilage like other joints in your body but also has a small disc in the joint which keeps the bones from rubbing on each other. These joints are like other joints and can get inflamed (sore) from arthritis and other problems. When this joint gets sore, it can cause headaches and pain in the jaw and the face. CAUSES  Usually the arthritic types of problems are caused by soreness in the joint. Soreness in the joint can also be caused by overuse. This may come from grinding your teeth. It may also come from mis-alignment in the joint. DIAGNOSIS Diagnosis of this condition can often be made by history and exam. Sometimes your caregiver may need X-rays or an MRI scan to determine the exact cause. It may be necessary to see your dentist to determine if your teeth and jaws are lined up correctly. TREATMENT  Most of the time this problem is not serious; however, sometimes it can persist (become chronic). When this happens medications that will cut down on inflammation (soreness) help. Sometimes a shot of cortisone into the joint will be helpful. If your teeth are not aligned it may help for your dentist to make a splint for your mouth that can help this problem. If no physical problems can be found, the problem may come from tension. If tension is found to be the cause, biofeedback or relaxation techniques may be helpful. HOME CARE INSTRUCTIONS   Later in the day, applications of ice packs may be helpful. Ice can be used in a plastic bag with a towel around it to prevent frostbite to skin. This may be used about every 2 hours for 20 to 30 minutes, as needed while awake, or as directed by your caregiver.  Only take over-the-counter or prescription medicines for pain, discomfort, or fever as directed by your caregiver.  If physical therapy was  prescribed, follow your caregiver's directions.  Wear mouth appliances as directed if they were given. Document Released: 05/23/2001 Document Revised: 11/20/2011 Document Reviewed: 08/30/2008 Exit[employer] Patient Information 2015 [employer]. This information is not intended to replace advice given to you by your health care provider. Make sure you discuss any questions you have with your health care provider.  

## 2015-04-06 NOTE — Addendum Note (Signed)
Addended by: Cydney Ok on: 04/06/2015 02:52 PM   Modules accepted: Orders, Medications

## 2015-04-06 NOTE — Addendum Note (Signed)
Addended by: Cydney Ok on: 04/06/2015 02:50 PM   Modules accepted: Medications

## 2015-11-02 ENCOUNTER — Ambulatory Visit (INDEPENDENT_AMBULATORY_CARE_PROVIDER_SITE_OTHER): Payer: BLUE CROSS/BLUE SHIELD | Admitting: Physician Assistant

## 2015-11-02 VITALS — BP 120/88 | HR 88 | Temp 98.5°F | Resp 17 | Ht 70.0 in | Wt 235.0 lb

## 2015-11-02 DIAGNOSIS — J208 Acute bronchitis due to other specified organisms: Secondary | ICD-10-CM

## 2015-11-02 MED ORDER — HYDROCODONE-HOMATROPINE 5-1.5 MG/5ML PO SYRP
5.0000 mL | ORAL_SOLUTION | Freq: Three times a day (TID) | ORAL | Status: DC | PRN
Start: 2015-11-02 — End: 2015-11-15

## 2015-11-02 MED ORDER — ALBUTEROL SULFATE HFA 108 (90 BASE) MCG/ACT IN AERS: 4.0000 | INHALATION_SPRAY | Freq: Four times a day (QID) | RESPIRATORY_TRACT | Status: DC | PRN

## 2015-11-02 MED ORDER — GUAIFENESIN ER 1200 MG PO TB12: 1.0000 | ORAL_TABLET | Freq: Two times a day (BID) | ORAL | Status: AC

## 2015-11-02 NOTE — Progress Notes (Signed)
Eddie Gross  MRN: [id number] DOB: 01/21/1976  Subjective:  Pt presents to clinic with 4 day h/o cough with SOB and wheezing for the last 4 days. He has seasonal allergies and has started his Flonase.  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  He is not sleeping well at night because of the cough.  He has some congestion but he thinks it is related to his allergies.  The cough is dry and coming from his chest - he has been using his albuterol but it does not seem to be helping much.   Patient Active Problem List   Diagnosis Date Noted  . Hypertriglyceridemia 02/04/2014  . 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 Pre-diabetes   . Acid reflux   . Insomnia     Current Outpatient Prescriptions on File Prior to Visit  Medication Sig Dispense Refill  . fluticasone (FLONASE) 50 MCG/ACT nasal spray Place 1 spray into both nostrils daily as needed for allergies or rhinitis.    Marland Kitchen ibuprofen (ADVIL,MOTRIN) 200 MG tablet Take 200-600 mg by mouth every 6 (six) hours as needed. For pain relief    . LORazepam (ATIVAN) 1 MG tablet Take 1 mg by mouth at bedtime.    . zaleplon (SONATA) 10 MG capsule Take by mouth.    . ranitidine (ZANTAC) 150 MG tablet Take 150 mg by mouth daily as needed for heartburn.      No current facility-administered medications on file prior to visit.    Allergies  Allergen Reactions  . Cyclobenzaprine Nausea And Vomiting  . Prochlorperazine Rash    Review of Systems  Constitutional: Positive for fatigue. Negative for fever and chills.  HENT: Negative for congestion and sore throat.   Respiratory: Positive for cough, shortness of breath and wheezing.        Asthma with allergies and smokes about 1/2 ppd  Gastrointestinal: Negative.   Allergic/Immunologic: Positive for environmental allergies.  Neurological: Positive for headaches.  Psychiatric/Behavioral: Positive for sleep disturbance (2nd to cough).   Objective:  BP 120/88 mmHg   Pulse 88  Temp(Src) 98.5 F (36.9 C) (Oral)  Resp 17  Ht  (1.778 m)  Wt 235 lb (106.595 kg)  BMI 33.72 kg/m2  SpO2 97%  Physical Exam  Constitutional: He is oriented to person, place, and time and well-developed, well-nourished, and in no distress.  HENT:  Head: Normocephalic and atraumatic.  Right Ear: Hearing, tympanic membrane, external ear and ear canal normal.  Left Ear: Hearing, tympanic membrane, external ear and ear canal normal.  Nose: Nose normal.  Mouth/Throat: Uvula is midline, oropharynx is clear and moist and mucous membranes are normal.  Eyes: Conjunctivae are normal.  Neck: Normal range of motion.  Cardiovascular: Normal rate, regular rhythm and normal heart sounds.   Pulmonary/Chest: Effort normal and breath sounds normal. He has no wheezes (no wheezing even with forced expiration).  Lymphadenopathy:       Head (right side): No tonsillar adenopathy present.       Head (left side): No tonsillar adenopathy present.    He has no cervical adenopathy.       Right: No supraclavicular adenopathy present.       Left: No supraclavicular adenopathy present.  Neurological: He is alert and oriented to person, place, and time. Gait normal.  Skin: Skin is warm and dry.  Psychiatric: Mood, memory, affect and judgment normal.    Assessment and Plan :  Viral bronchitis -  Plan: HYDROcodone-homatropine (HYCODAN) 5-1.5 MG/5ML syrup, Guaifenesin (MUCINEX MAXIMUM STRENGTH) 1200 MG TB12, albuterol (PROVENTIL HFA;VENTOLIN HFA) 108 (90 Base) MCG/ACT inhaler   Symptomatic treatment - let me know if the symptoms do not get better - there is no wheezing at this time but he does not improve with symptomatic treatment he may need further evaluation at that time  Benny Lennert PA-C  [facility] 11/02/2015 6:49 PM

## 2015-11-02 NOTE — Progress Notes (Signed)
[facility] [address] [place] [address] [phone]  Date:  11/02/2015   Name:  Eddie Gross   DOB:  06-Jul-1976   MRN:  [id number]  PCP:  Default, Provider, MD   Chief Complaint  Patient presents with  . Shortness of Breath  . Cough     History of Present Illness:  Eddie Gross is a 40 y.o. male patient who presents to [facility] for cc of    Patient Active Problem List   Diagnosis Date Noted  . Pre-diabetes   . Acid reflux   . Insomnia     Past Medical History  Diagnosis Date  . Anxiety   . Asthma   . GERD (gastroesophageal reflux disease)   . Allergy     RHINITIS  . Hyperlipidemia   . Pre-diabetes   . Acid reflux   . Insomnia     No past surgical history on file.  Social History  Substance Use Topics  . Smoking status: Current Every Day Smoker -- 1.00 packs/day for 29 years    Types: Cigarettes  . Smokeless tobacco: Not on file  . Alcohol Use: No    Family History  Problem Relation Age of Onset  . Heart disease Father     CORNARY HEART DISEASE/ 2 BYPASS SURGERIES  . Cancer Father     PROSTATE  . Heart disease Paternal Grandfather     CORNARY HEART DISEASE  . Arthritis Paternal Grandfather     Allergies  Allergen Reactions  . Other Nausea And Vomiting    Medication list has been reviewed and updated.  Current Outpatient Prescriptions on File Prior to Visit  Medication Sig Dispense Refill  . fluticasone (FLONASE) 50 MCG/ACT nasal spray Place 1 spray into both nostrils daily as needed for allergies or rhinitis.    Marland Kitchen ibuprofen (ADVIL,MOTRIN) 200 MG tablet Take 200-600 mg by mouth every 6 (six) hours as needed. For pain relief    . LORazepam (ATIVAN) 1 MG tablet Take 1 mg by mouth at bedtime.    Marland Kitchen LORazepam (ATIVAN) 1 MG tablet TAKE 1 TABLET BY MOUTH EVERY EVENING    . ranitidine (ZANTAC) 150 MG tablet Take by mouth.    . zaleplon (SONATA) 10 MG capsule Take 10 mg by mouth at bedtime as needed for sleep.   1  .  zaleplon (SONATA) 10 MG capsule Take by mouth.    Marland Kitchen albuterol (PROVENTIL HFA;VENTOLIN HFA) 108 (90 BASE) MCG/ACT inhaler Inhale 4 puffs into the lungs every 6 (six) hours as needed for wheezing. Reported on 11/02/2015    . ranitidine (ZANTAC) 150 MG tablet Take 150 mg by mouth daily as needed for heartburn.      No current facility-administered medications on file prior to visit.    ROS   Physical Examination: BP 120/88 mmHg  Pulse 88  Temp(Src) 98.5 F (36.9 C) (Oral)  Resp 17  Ht  (1.778 m)  Wt 235 lb (106.595 kg)  BMI 33.72 kg/m2  SpO2 97% Ideal Body Weight: Weight in (lb) to have BMI = 25: 173.9  Physical Exam   Assessment and Plan: Eddie Gross is a 40 y.o. male who is here today  There are no diagnoses linked to this encounter.  Trena Platt, PA-C [facility] 11/02/2015 5:38 PM

## 2015-11-11 ENCOUNTER — Telehealth: Payer: Self-pay

## 2015-11-11 NOTE — Telephone Encounter (Signed)
Patient is requesting a refill on the HYDROcodone-homatropine [facility]) 5-1.5 MG/5ML syrup [[id number] states that he is still coughing really bad. Please contact patient.

## 2015-11-12 NOTE — Telephone Encounter (Signed)
If his cough is no better I think he should be rechecked after a week.  Esp with his h/o asthma he might need different treatment than just cough medication.

## 2015-11-15 ENCOUNTER — Ambulatory Visit (INDEPENDENT_AMBULATORY_CARE_PROVIDER_SITE_OTHER): Payer: BLUE CROSS/BLUE SHIELD | Admitting: Physician Assistant

## 2015-11-15 VITALS — BP 120/80 | HR 101 | Temp 98.5°F | Resp 19 | Ht 70.0 in | Wt 237.2 lb

## 2015-11-15 DIAGNOSIS — J209 Acute bronchitis, unspecified: Secondary | ICD-10-CM | POA: Diagnosis not present

## 2015-11-15 MED ORDER — ALBUTEROL SULFATE HFA 108 (90 BASE) MCG/ACT IN AERS: 4.0000 | INHALATION_SPRAY | Freq: Four times a day (QID) | RESPIRATORY_TRACT | Status: DC | PRN

## 2015-11-15 MED ORDER — AZITHROMYCIN 250 MG PO TABS: ORAL_TABLET | ORAL | Status: DC

## 2015-11-15 MED ORDER — HYDROCODONE-HOMATROPINE 5-1.5 MG/5ML PO SYRP: 5.0000 mL | ORAL_SOLUTION | Freq: Three times a day (TID) | ORAL | Status: DC | PRN

## 2015-11-15 NOTE — Progress Notes (Signed)
Subjective:     Patient ID: Eddie Gross, male   DOB: 1976-07-20, 40 y.o.   MRN: [id number]  HPI Patient presents today for a recheck of viral bronchitis, diagnosed on 11/01/14. At this visit patient was started on Hycodan cough syrup, albuterol inhaler prn and and flonase daily. The patient still endorses chest tightness, difficulty breathing and sinus congestion. He states the cough syrup helped break up the mucus and allowed him to to have a productive cough, since he ran out of the Hydrocan two days ago he has has increased chest pressure, tightness and no secretions. He is currently using the Albuterol inhaler 5-6x/day, Flonase spray 1x/day which have helped but his symptoms are still persistent. Also endorses cough and congestion worse at nighttime.  He has shortness of breath with coughing spells and associated headaches and lightheadedness at times.   Patient is a current smoker to smoker, before infection he was smoking 1 pack per day, now he has decreased to 2-3 cigarettes daily.   Review of Systems General: Endorses poor sleep habits, daily fatigue due to productive cough, denies fevers, chills, or nightsweats Head: Endorses some headaches with SOB, lightheadedness x2 weeks Ears: Endorses scratchy ears, denies fluid, fullness or drainage. Eyes: Endorses periorbital puffing and itching, denies watery eyes. Nose: Endorses Nasal congestion, denies rhinorrhea. Throat: Endorses Mucus drainage, productive cough with dark yellow brown mucus.   Pulm: Endorses shortness of breath, limiting daily activities.  CV: Endorses mild palpitations with coughing, denies chest pain.  Objective:   Physical Exam  Constitutional: He appears well-developed and well-nourished. No distress.  HENT:  Right Ear: External ear normal.  Left Ear: External ear normal.  Nose: Nose normal.  Mouth/Throat: Oropharynx is clear and moist. No oropharyngeal exudate.  No frontal or maxillary sinus tenderness  Neck: Neck  supple.  Minimal lymphadenopathy of anterior cervical chain  Cardiovascular: Normal rate, regular rhythm, normal heart sounds and intact distal pulses.  Exam reveals no gallop and no friction rub.   No murmur heard. Pulmonary/Chest: Breath sounds normal. No respiratory distress. He has no wheezes. He has no rales. He exhibits no tenderness.  Increased respiratory effort  Skin: He is not diaphoretic.       Assessment:     Patient is a 40 year old white male who presented for a follow-up of viral bronchitis with persistent symptoms over 2 weeks. He has some relief from continued use of albuterol, flonase, and hycodan cough syrup but chest tightness and respiratory complaints have remained persistent. Due to the prolonged timeframe of illness course and symptoms, this is likely a bacterial bronchitis. Will continue current pulmonary medication regimen and start patient on antibiotic therapy as well.    Plan:     1. Acute bronchitis, unspecified organism - Continue Fluticosone (flonase) nasal spray daily - HYDROcodone-homatropine (HYCODAN) 5-1.5 MG/5ML syrup; Take 5 mLs by mouth every 8 (eight) hours as needed for cough.  Dispense: 120 mL; Refill: 0 - albuterol (PROVENTIL HFA;VENTOLIN HFA) 108 (90 Base) MCG/ACT inhaler; Inhale 4 puffs into the lungs every 6 (six) hours as needed for wheezing. Reported on 11/02/2015  Dispense: 18 g; Refill: 0 - azithromycin (ZITHROMAX) 250 MG tablet; Take 2 tabs PO x 1 dose, then 1 tab PO QD x 4 days  Dispense: 6 tablet; Refill: 0 - increase fluid intake, rest and hydrate  - Counseled patient on the importance of decreasing smoking, and his increased risk of pulmonary congestion and bacterial infections.

## 2015-11-15 NOTE — Patient Instructions (Signed)
Get plenty of rest and drink at least 64 ounces of water daily.  Did you know that you begin to benefit from quitting smoking within the first twenty minutes? It's TRUE.  At 20 minutes: -blood pressure decreases -pulse rate drops -body temperature of hands and feet increases  At 8 hours: -carbon monoxide level in blood drops to normal -oxygen level in blood increases to normal  At 24 hours: -the chance of heart attack decreases  At 48 hours: -nerve endings start regrowing -ability to smell and taste is enhanced  2 weeks-3 months: -circulation improves -walking becomes easier -lung function improves  1-9 months: -coughing, sinus congestion, fatigue and shortness of breath decreases  1 year: -excess risk of heart disease is decreased to HALF that of a smoker  5 years: Stroke risk is reduced to that of people who have never smoked  10 years: -risk of lung cancer drops to as little as half that of continuing smokers -risk of cancer of the mouth, throat, esophagus, bladder, kidney and pancreas decreases -risk of ulcer decreases  15 years -risk of heart disease is now similar to that of people who have never smoked -risk of death returns to nearly the level of people who have never smoked  

## 2015-11-15 NOTE — Progress Notes (Signed)
Patient ID: Eddie Gross, male    DOB: 11/10/1975, 40 y.o.   MRN: [id number]  PCP: No primary care provider on file.  Subjective:   Chief Complaint  Patient presents with  . Follow-up    bronchitis  . Medication Refill    hycodan    HPI Presents for recheck of viral bronchitis, diagnosed on 11/01/14.   At that visit patient was started on Hycodan cough syrup, albuterol inhaler prn and and flonase daily. The patient still endorses chest tightness, difficulty breathing and sinus congestion. He states the cough syrup helped break up the mucus and allowed him to to have a productive cough.   Since he ran out of the Hydrocan two days ago he has has increased chest pressure, tightness and no secretions. He is currently using the Albuterol inhaler 5-6x/day, Flonase spray 1x/day which have helped but his symptoms are still persistent. Also endorses cough and congestion worse at nighttime. He has shortness of breath with coughing spells and associated headaches and lightheadedness at times.   Patient is a current smoker to smoker, before infection he was smoking 1 pack per day, now he has decreased to 2-3 cigarettes daily.    Review of Systems General: Endorses poor sleep habits, daily fatigue due to productive cough, denies fevers, chills, or nightsweats Head: Endorses some headaches with SOB, lightheadedness x2 weeks Ears: Endorses scratchy ears, denies fluid, fullness or drainage. Eyes: Endorses periorbital puffing and itching, denies watery eyes. Nose: Endorses Nasal congestion, denies rhinorrhea. Throat: Endorses Mucus drainage, productive cough with dark yellow brown mucus.  Pulm: Endorses shortness of breath, limiting daily activities.  CV: Endorses mild palpitations with coughing, denies chest pain.    Patient Active Problem List   Diagnosis Date Noted  . Hypertriglyceridemia 02/04/2014  . 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 Pre-diabetes   . Acid reflux   . Insomnia       Prior to Admission medications   Medication Sig Start Date End Date Taking? Authorizing Provider  albuterol (PROVENTIL HFA;VENTOLIN HFA) 108 (90 Base) MCG/ACT inhaler Inhale 4 puffs into the lungs every 6 (six) hours as needed for wheezing. Reported on 11/02/2015 11/02/15  Yes Sarah Harvie Bridge, PA-C  fluticasone [facility]) 50 MCG/ACT nasal spray Place 1 spray into both nostrils daily as needed for allergies or rhinitis.   Yes Historical Provider, MD  HYDROcodone-homatropine (HYCODAN) 5-1.5 MG/5ML syrup Take 5 mLs by mouth every 8 (eight) hours as needed for cough. 11/02/15  Yes Morrell Riddle, PA-C  ibuprofen (ADVIL,MOTRIN) 200 MG tablet Take 200-600 mg by mouth every 6 (six) hours as needed. For pain relief   Yes Historical Provider, MD  LORazepam (ATIVAN) 1 MG tablet Take 1 mg by mouth at bedtime.   Yes Historical Provider, MD  ranitidine (ZANTAC) 150 MG tablet Take 150 mg by mouth daily as needed for heartburn.    Yes Historical Provider, MD  zaleplon (SONATA) 10 MG capsule Take by mouth. 02/21/15  Yes Historical Provider, MD     Allergies  Allergen Reactions  . Cyclobenzaprine Nausea And Vomiting  . Prochlorperazine Rash       Objective:  Physical Exam  Constitutional: He is oriented to person, place, and time. He appears well-developed and well-nourished. He is active and cooperative. No distress.  BP 120/80 mmHg  Pulse 101  Temp(Src) 98.5 F (36.9 C) (Oral)  Resp 19  Ht  (1.778 m)  Wt 237 lb 3.2 oz (107.593 kg)  BMI 34.03 kg/m2  SpO2 99%  HENT:  Head: Normocephalic and atraumatic.  Right Ear: Hearing, tympanic membrane, external ear and ear canal normal.  Left Ear: Hearing, tympanic membrane, external ear and ear canal normal.  Nose: Nose normal. Right sinus exhibits no maxillary sinus tenderness and no frontal sinus tenderness. Left sinus exhibits no maxillary sinus tenderness and no frontal sinus tenderness.  Mouth/Throat: Uvula is midline, oropharynx is clear and  moist and mucous membranes are normal. No oral lesions.  Eyes: Conjunctivae are normal. No scleral icterus.  Neck: Normal range of motion. Neck supple. No thyromegaly present.  Cardiovascular: Normal rate, regular rhythm and normal heart sounds.   Pulses:      Radial pulses are 2+ on the right side, and 2+ on the left side.  Pulmonary/Chest: Effort normal and breath sounds normal.  Lymphadenopathy:       Head (right side): No tonsillar, no preauricular, no posterior auricular and no occipital adenopathy present.       Head (left side): No tonsillar, no preauricular, no posterior auricular and no occipital adenopathy present.    He has no cervical adenopathy.       Right: No supraclavicular adenopathy present.       Left: No supraclavicular adenopathy present.  Neurological: He is alert and oriented to person, place, and time. No sensory deficit.  Skin: Skin is warm, dry and intact. No rash noted. No cyanosis or erythema. Nails show no clubbing.  Psychiatric: He has a normal mood and affect. His speech is normal and behavior is normal.           Assessment & Plan:   1. Acute bronchitis, unspecified organism Suspect bacterial etiology of illness given duration without significant improvement on supportive care. Refill albuterol and Hycodan. Start Azithromycin. - HYDROcodone-homatropine (HYCODAN) 5-1.5 MG/5ML syrup; Take 5 mLs by mouth every 8 (eight) hours as needed for cough.  Dispense: 120 mL; Refill: 0 - albuterol (PROVENTIL HFA;VENTOLIN HFA) 108 (90 Base) MCG/ACT inhaler; Inhale 4 puffs into the lungs every 6 (six) hours as needed for wheezing. Reported on 11/02/2015  Dispense: 18 g; Refill: 0 - azithromycin (ZITHROMAX) 250 MG tablet; Take 2 tabs PO x 1 dose, then 1 tab PO QD x 4 days  Dispense: 6 tablet; Refill: 0   Fernande Brashelle S. Azlaan Isidore, PA-C Physician Assistant-Certified [facility]

## 2015-12-18 ENCOUNTER — Emergency Department (HOSPITAL_COMMUNITY): Payer: BLUE CROSS/BLUE SHIELD

## 2015-12-18 ENCOUNTER — Encounter (HOSPITAL_COMMUNITY): Payer: Self-pay | Admitting: *Deleted

## 2015-12-18 DIAGNOSIS — Z8639 Personal history of other endocrine, nutritional and metabolic disease: Secondary | ICD-10-CM | POA: Insufficient documentation

## 2015-12-18 DIAGNOSIS — F419 Anxiety disorder, unspecified: Secondary | ICD-10-CM | POA: Diagnosis not present

## 2015-12-18 DIAGNOSIS — F17210 Nicotine dependence, cigarettes, uncomplicated: Secondary | ICD-10-CM | POA: Insufficient documentation

## 2015-12-18 DIAGNOSIS — Z8669 Personal history of other diseases of the nervous system and sense organs: Secondary | ICD-10-CM | POA: Diagnosis not present

## 2015-12-18 DIAGNOSIS — Z79899 Other long term (current) drug therapy: Secondary | ICD-10-CM | POA: Diagnosis not present

## 2015-12-18 DIAGNOSIS — J449 Chronic obstructive pulmonary disease, unspecified: Secondary | ICD-10-CM | POA: Diagnosis not present

## 2015-12-18 DIAGNOSIS — R222 Localized swelling, mass and lump, trunk: Secondary | ICD-10-CM | POA: Diagnosis not present

## 2015-12-18 DIAGNOSIS — R079 Chest pain, unspecified: Secondary | ICD-10-CM | POA: Diagnosis present

## 2015-12-18 DIAGNOSIS — K219 Gastro-esophageal reflux disease without esophagitis: Secondary | ICD-10-CM | POA: Insufficient documentation

## 2015-12-18 NOTE — ED Notes (Signed)
Looking straight ahead not making eye contact with any one in the room

## 2015-12-18 NOTE — ED Notes (Signed)
The pt thinks he has brionchitis and he feels like he has lung cancer he reports that he feels like he has a tumor in his lt lung no physical distress

## 2015-12-19 ENCOUNTER — Emergency Department (HOSPITAL_COMMUNITY)
Admission: EM | Admit: 2015-12-19 | Discharge: 2015-12-19 | Disposition: A | Discharge status: Home / Self Care | Payer: BLUE CROSS/BLUE SHIELD | Attending: Emergency Medicine | Admitting: Emergency Medicine

## 2015-12-19 DIAGNOSIS — R222 Localized swelling, mass and lump, trunk: Secondary | ICD-10-CM

## 2015-12-19 LAB — CBC WITH DIFFERENTIAL/PLATELET
BASOS ABS: 0.1 10*3/uL (ref 0.0–0.1)
Basophils Relative: 1 %
Eosinophils Absolute: 1 10*3/uL — ABNORMAL HIGH (ref 0.0–0.7)
Eosinophils Relative: 11 %
HCT: 40.3 % (ref 39.0–52.0)
Hemoglobin: 14.6 g/dL (ref 13.0–17.0)
LYMPHS ABS: 2.5 10*3/uL (ref 0.7–4.0)
Lymphocytes Relative: 28 %
MCH: 33.4 pg (ref 26.0–34.0)
MCHC: 36.2 g/dL — ABNORMAL HIGH (ref 30.0–36.0)
MCV: 92.2 fL (ref 78.0–100.0)
MONO ABS: 0.6 10*3/uL (ref 0.1–1.0)
MONOS PCT: 7 %
NEUTROS PCT: 53 %
Neutro Abs: 4.8 10*3/uL (ref 1.7–7.7)
PLATELETS: 186 10*3/uL (ref 150–400)
RBC: 4.37 MIL/uL (ref 4.22–5.81)
RDW: 13.2 % (ref 11.5–15.5)
WBC: 9 10*3/uL (ref 4.0–10.5)

## 2015-12-19 LAB — I-STAT TROPONIN, ED: Troponin i, poc: 0 ng/mL (ref 0.00–0.08)

## 2015-12-19 LAB — BASIC METABOLIC PANEL
ANION GAP: 10 (ref 5–15)
BUN: 9 mg/dL (ref 6–20)
CO2: 22 mmol/L (ref 22–32)
Calcium: 9.3 mg/dL (ref 8.9–10.3)
Chloride: 105 mmol/L (ref 101–111)
Creatinine, Ser: 0.9 mg/dL (ref 0.61–1.24)
Glucose, Bld: 94 mg/dL (ref 65–99)
POTASSIUM: 4.3 mmol/L (ref 3.5–5.1)
Sodium: 137 mmol/L (ref 135–145)

## 2015-12-19 MED ORDER — KETOROLAC TROMETHAMINE 30 MG/ML IJ SOLN
60.0000 mg | Freq: Once | INTRAMUSCULAR | Status: AC
  Administered 2015-12-19: 60 mg via INTRAMUSCULAR
  Filled 2015-12-19: qty 2

## 2015-12-19 MED ORDER — KETOROLAC TROMETHAMINE 30 MG/ML IJ SOLN
30.0000 mg | Freq: Once | INTRAMUSCULAR | Status: DC
  Filled 2015-12-19: qty 1

## 2015-12-19 MED ORDER — BENZONATATE 100 MG PO CAPS: 100.0000 mg | ORAL_CAPSULE | Freq: Three times a day (TID) | ORAL | Status: DC

## 2015-12-19 NOTE — ED Provider Notes (Signed)
CSN: [id number]     Arrival date & time 12/18/15  2313 History  By signing my name below, I, Freida Busman, attest that this documentation has been prepared under the direction and in the presence of Tomasita Crumble, MD . Electronically Signed: Freida Busman, Scribe. 12/19/2015. 1:36 AM.     Chief Complaint  Patient presents with  . thinks he has cancer     The history is provided by the patient. No language interpreter was used.     HPI Comments:  Eddie Gross is a 40 y.o. male with a history of COPD, who presents to the Emergency Department complaining of CP x 2 months with associated persistent cough. He notes his pain radiates from left chest to right. His pain is exacerbated with movement/exertion. Pt states he has been evaluated at Urgent care twice. He was diagnosed with bronchitis and discharged with cough syrup which provided mild relief until he finished it. He denies fever. Pt notes his symptoms today are similar to past COPD flares ups.  At this time pt also complains of a bump to his left breast. Pt reports FHx of breast CA (mom) and prostate CA (dad). He deneis cardiac hx and h/o blood clots. No alleviating factors noted.  Past Medical History  Diagnosis Date  . Anxiety   . Asthma   . GERD (gastroesophageal reflux disease)   . Allergy     RHINITIS  . Hyperlipidemia   . Pre-diabetes   . Acid reflux   . Insomnia    History reviewed. No pertinent past surgical history. Family History  Problem Relation Age of Onset  . Heart disease Father     CORNARY HEART DISEASE/ 2 BYPASS SURGERIES  . Cancer Father     PROSTATE  . Heart disease Paternal Grandfather     CORNARY HEART DISEASE  . Arthritis Paternal Grandfather    Social History  Substance Use Topics  . Smoking status: Current Every Day Smoker -- 1.00 packs/day for 29 years    Types: Cigarettes  . Smokeless tobacco: None  . Alcohol Use: No    Review of Systems  10 systems reviewed and all are negative for acute  change except as noted in the HPI.  Allergies  Cyclobenzaprine and Prochlorperazine  Home Medications   Prior to Admission medications   Medication Sig Start Date End Date Taking? Authorizing Provider  albuterol (PROVENTIL HFA;VENTOLIN HFA) 108 (90 Base) MCG/ACT inhaler Inhale 4 puffs into the lungs every 6 (six) hours as needed for wheezing. Reported on 11/02/2015 11/15/15  Yes Chelle Jeffery, PA-C  ibuprofen (ADVIL,MOTRIN) 200 MG tablet Take 200-600 mg by mouth every 6 (six) hours as needed. For pain relief   Yes Historical Provider, MD  LORazepam (ATIVAN) 1 MG tablet Take 1 mg by mouth at bedtime.   Yes Historical Provider, MD  ranitidine (ZANTAC) 150 MG tablet Take 150 mg by mouth daily as needed for heartburn.    Yes Historical Provider, MD  zaleplon (SONATA) 10 MG capsule Take 10 mg by mouth at bedtime.  02/21/15  Yes Historical Provider, MD  azithromycin (ZITHROMAX) 250 MG tablet Take 2 tabs PO x 1 dose, then 1 tab PO QD x 4 days Patient not taking: Reported on 12/19/2015 11/15/15   Chelle Jeffery, PA-C  HYDROcodone-homatropine (HYCODAN) 5-1.5 MG/5ML syrup Take 5 mLs by mouth every 8 (eight) hours as needed for cough. Patient not taking: Reported on 12/19/2015 11/15/15   Chelle Jeffery, PA-C   BP 130/80 mmHg  Pulse  85  Temp(Src) 98.2 F (36.8 C) (Oral)  Resp 18  Ht  (1.803 m)  Wt 242 lb 2 oz (109.827 kg)  BMI 33.78 kg/m2  SpO2 97% Physical Exam  Constitutional: He is oriented to person, place, and time. Vital signs are normal. He appears well-developed and well-nourished.  Non-toxic appearance. He does not appear ill. No distress.  HENT:  Head: Normocephalic and atraumatic.  Nose: Nose normal.  Mouth/Throat: Oropharynx is clear and moist. No oropharyngeal exudate.  Eyes: Conjunctivae and EOM are normal. Pupils are equal, round, and reactive to light. No scleral icterus.  Neck: Normal range of motion. Neck supple. No tracheal deviation, no edema, no erythema and normal range of  motion present. No thyroid mass and no thyromegaly present.  Cardiovascular: Normal rate, regular rhythm, S1 normal, S2 normal, normal heart sounds, intact distal pulses and normal pulses.  Exam reveals no gallop and no friction rub.   No murmur heard. Pulmonary/Chest: Effort normal and breath sounds normal. No respiratory distress. He has no wheezes. He has no rhonchi. He has no rales.  Abdominal: Soft. Normal appearance and bowel sounds are normal. He exhibits no distension, no ascites and no mass. There is no hepatosplenomegaly. There is no tenderness. There is no rebound, no guarding and no CVA tenderness.  Genitourinary:     Musculoskeletal: Normal range of motion. He exhibits no edema or tenderness.  Lymphadenopathy:    He has no cervical adenopathy.  Neurological: He is alert and oriented to person, place, and time. He has normal strength. No cranial nerve deficit or sensory deficit.  Skin: Skin is warm, dry and intact. No petechiae and no rash noted. He is not diaphoretic. No erythema. No pallor.  Psychiatric: He has a normal mood and affect. His behavior is normal. Judgment normal.  Nursing note and vitals reviewed.   ED Course  Procedures  DIAGNOSTIC STUDIES:  Oxygen Saturation is 98% on RA, normal by my interpretation.    COORDINATION OF CARE:  1:20 AM Discussed treatment plan with pt at bedside and pt agreed to plan.  Labs Review Labs Reviewed  CBC WITH DIFFERENTIAL/PLATELET - Abnormal; Notable for the following:    MCHC 36.2 (*)    All other components within normal limits  BASIC METABOLIC PANEL  Rosezena Sensor, ED    Imaging Review Dg Chest 2 View  12/19/2015  CLINICAL DATA:  Chest pain for 2 days. EXAM: CHEST  2 VIEW COMPARISON:  03/22/2015 FINDINGS: The heart size and mediastinal contours are within normal limits. Both lungs are clear. The visualized skeletal structures are unremarkable. IMPRESSION: No active cardiopulmonary disease. Electronically Signed   By:  Ellery Plunk M.D.   On: 12/19/2015 00:08   I have personally reviewed and evaluated these images and lab results as part of my medical decision-making.   EKG Interpretation   Date/Time:  Sunday December 19 2015 02:50:35 EDT Ventricular Rate:  80 PR Interval:  161 QRS Duration: 85 QT Interval:  352 QTC Calculation: 406 R Axis:   87 Text Interpretation:  Sinus rhythm No significant change since last  tracing Confirmed by Erroll Luna [id number] on 12/19/2015 8:14:59 AM      MDM   Final diagnoses:  None   Patient presents to the ED for chest pain and a lump.  His chest pain history is low risk for ACS.  Will evaluate with 2 sets of troponins and the HEART score.  His lump I can not palpate and he can not  show me where it is either.  PCP fu advised for possible cancer screening.  Patient is requesting to leave prior to 2nd troponin.  HEART score is less than 3.  Advised to see pcp within 3 days.  He appears well and in NAD.  VS remain within his normal limits and he is safe for DC.   I personally performed the services described in this documentation, which was scribed in my presence. The recorded information has been reviewed and is accurate.      Tomasita CrumbleAdeleke Anjanette Gilkey, MD 12/19/15 [id number]

## 2015-12-19 NOTE — Discharge Instructions (Signed)
Chest Wall Pain Mr. Loleta ChanceHill, see a primary care doctor within 3 days for evaluation of your chest mass.  If any symptoms worsen, come back to the ED immediately. Thank you. Chest wall pain is pain in or around the bones and muscles of your chest. Sometimes, an injury causes this pain. Sometimes, the cause may not be known. This pain may take several weeks or longer to get better. HOME CARE Pay attention to any changes in your symptoms. Take these actions to help with your pain:  Rest as told by your doctor.  Avoid activities that cause pain. Try not to use your chest, belly (abdominal), or side muscles to lift heavy things.  If directed, apply ice to the painful area:  Put ice in a plastic bag.  Place a towel between your skin and the bag.  Leave the ice on for 20 minutes, 2-3 times per day.  Take over-the-counter and prescription medicines only as told by your doctor.  Do not use tobacco products, including cigarettes, chewing tobacco, and e-cigarettes. If you need help quitting, ask your doctor.  Keep all follow-up visits as told by your doctor. This is important. GET HELP IF:  You have a fever.  Your chest pain gets worse.  You have new symptoms. GET HELP RIGHT AWAY IF:  You feel sick to your stomach (nauseous) or you throw up (vomit).  You feel sweaty or light-headed.  You have a cough with phlegm (sputum) or you cough up blood.  You are short of breath.   This information is not intended to replace advice given to you by your health care provider. Make sure you discuss any questions you have with your health care provider.   Document Released: 02/14/2008 Document Revised: 05/19/2015 Document Reviewed: 11/23/2014 Elsevier Interactive Patient Education [employer]2016 Elsevier Inc.

## 2015-12-19 NOTE — ED Notes (Signed)
Pt wanted cough medicine, Oni MD prescribed tessalon pills, pt refused bc he wanted tussinox (with codeine) Oni was not going to prescribe. Pt handed me back prescription and walked out

## 2016-01-05 ENCOUNTER — Emergency Department (HOSPITAL_COMMUNITY): Payer: BLUE CROSS/BLUE SHIELD

## 2016-01-05 ENCOUNTER — Encounter (HOSPITAL_COMMUNITY): Payer: Self-pay | Admitting: Emergency Medicine

## 2016-01-05 ENCOUNTER — Emergency Department (HOSPITAL_COMMUNITY)
Admission: EM | Admit: 2016-01-05 | Discharge: 2016-01-05 | Discharge status: Against Medical Advice | Payer: BLUE CROSS/BLUE SHIELD | Attending: Emergency Medicine | Admitting: Emergency Medicine

## 2016-01-05 DIAGNOSIS — K219 Gastro-esophageal reflux disease without esophagitis: Secondary | ICD-10-CM | POA: Insufficient documentation

## 2016-01-05 DIAGNOSIS — Z8639 Personal history of other endocrine, nutritional and metabolic disease: Secondary | ICD-10-CM | POA: Diagnosis not present

## 2016-01-05 DIAGNOSIS — R0781 Pleurodynia: Secondary | ICD-10-CM

## 2016-01-05 DIAGNOSIS — R05 Cough: Secondary | ICD-10-CM | POA: Diagnosis present

## 2016-01-05 DIAGNOSIS — F17210 Nicotine dependence, cigarettes, uncomplicated: Secondary | ICD-10-CM | POA: Insufficient documentation

## 2016-01-05 DIAGNOSIS — Z8709 Personal history of other diseases of the respiratory system: Secondary | ICD-10-CM | POA: Diagnosis not present

## 2016-01-05 DIAGNOSIS — J45909 Unspecified asthma, uncomplicated: Secondary | ICD-10-CM | POA: Diagnosis not present

## 2016-01-05 DIAGNOSIS — R0789 Other chest pain: Secondary | ICD-10-CM | POA: Insufficient documentation

## 2016-01-05 DIAGNOSIS — Z79899 Other long term (current) drug therapy: Secondary | ICD-10-CM | POA: Insufficient documentation

## 2016-01-05 DIAGNOSIS — G4700 Insomnia, unspecified: Secondary | ICD-10-CM | POA: Diagnosis not present

## 2016-01-05 DIAGNOSIS — F419 Anxiety disorder, unspecified: Secondary | ICD-10-CM | POA: Insufficient documentation

## 2016-01-05 MED ORDER — METHOCARBAMOL 500 MG PO TABS: 1000.0000 mg | ORAL_TABLET | Freq: Once | ORAL | Status: DC

## 2016-01-05 MED ORDER — DEXAMETHASONE SODIUM PHOSPHATE 10 MG/ML IJ SOLN: 10.0000 mg | Freq: Once | INTRAMUSCULAR | Status: DC

## 2016-01-05 MED ORDER — DIAZEPAM 5 MG PO TABS: 5.0000 mg | ORAL_TABLET | Freq: Once | ORAL | Status: DC

## 2016-01-05 NOTE — ED Notes (Signed)
Pt no where to be found in room

## 2016-01-05 NOTE — ED Notes (Signed)
Pt comes to ed via pov, c/o of pain when coughing and SOB on movement. Pt started hurting yesterday around 5pm. He says he feels SOB when moving around only not while laying down.  Pt has a hx of asthma and his inhaler is not working for him.  Pt is not actively wheezing, but his cough sounds dry and he reports sharp pains when coughing mainly on his right side.  Pt is a smoker verbalized 1 pack day but is trying to quit.

## 2016-01-05 NOTE — ED Notes (Signed)
Provider was notified pt left AMA, pt d/c from system AMA at this time.

## 2016-01-05 NOTE — ED Notes (Signed)
Pt was seen here in ed to night around 4:30am, c/o of lung pain and Dry cough/ SOB on movement only. On assessment and triage, lungs were noted as slightly diminished on ausculation. Pt Stated he started hurting yesterday afternoon around 5 pm. He tried utilizing his asthma inhalers multiple times without relief.  He was having trouble sleeping so he came into the Ed tonight.   After finishing his triage and primary assessment, respiratory assessment, cough assessment the doctor entered the room. Shortly after this time the patient decided to leave AMA without letting anyone know he left.  The tech was the first to notice the patient was not in the room. The nurse was busy getting new report for EMS arrival assault pt coming to room 15.  The staff searched the unit and checked outside and xray department  for the patient. The nurse then checked with charge nurse and documented the patients leaving AMA. Provider was also notified.

## 2016-01-05 NOTE — ED Notes (Signed)
[facility]

## 2016-01-05 NOTE — ED Provider Notes (Signed)
CSN: [id number]     Arrival date & time 01/05/16  0418 History   First MD Initiated Contact with Patient 01/05/16 0440 AM   Chief Complaint  Patient presents with  . Shortness of Breath    pain coughing and sob on movement   . Cough    right side pain      (Consider location/radiation/quality/duration/timing/severity/associated sxs/prior Treatment) HPI  Patient states yesterday afternoon he started having sharp pain underneath his right rib cage when he coughs. He states any kind of movement or deep breathing makes the pain worse. He states "I took a whole bottle ibuprofen, Aleve, aspirin and use my inhaler" for the pain today without relief. He states he has some worsening of his cough and is worse at night. He denies fever or feeling short of breath. He denies any wheezing. He states he finished his shift at work and then came to the emergency department.   PCP Dr Modesto Charon   Past Medical History  Diagnosis Date  . Anxiety   . Asthma   . GERD (gastroesophageal reflux disease)   . Allergy     RHINITIS  . Hyperlipidemia   . Pre-diabetes   . Acid reflux   . Insomnia    History reviewed. No pertinent past surgical history. Family History  Problem Relation Age of Onset  . Heart disease Father     CORNARY HEART DISEASE/ 2 BYPASS SURGERIES  . Cancer Father     PROSTATE  . Heart disease Paternal Grandfather     CORNARY HEART DISEASE  . Arthritis Paternal Grandfather    Social History  Substance Use Topics  . Smoking status: Current Every Day Smoker -- 1.00 packs/day for 29 years    Types: Cigarettes  . Smokeless tobacco: None  . Alcohol Use: No  Patient states he works for the [employer] and he can have to push and pull things weighing up to 500 pounds.  Review of Systems  All other systems reviewed and are negative.     Allergies  Cyclobenzaprine and Prochlorperazine  Home Medications   Prior to Admission medications   Medication Sig Start Date End Date  Taking? Authorizing Provider  albuterol (PROVENTIL HFA;VENTOLIN HFA) 108 (90 Base) MCG/ACT inhaler Inhale 4 puffs into the lungs every 6 (six) hours as needed for wheezing. Reported on 11/02/2015 11/15/15   Porfirio Oar, PA-C  benzonatate (TESSALON) 100 MG capsule Take 1 capsule (100 mg total) by mouth every 8 (eight) hours. 12/19/15   Tomasita Crumble, MD  ibuprofen (ADVIL,MOTRIN) 200 MG tablet Take 200-600 mg by mouth every 6 (six) hours as needed. For pain relief    Historical Provider, MD  LORazepam (ATIVAN) 1 MG tablet Take 1 mg by mouth at bedtime.    Historical Provider, MD  ranitidine (ZANTAC) 150 MG tablet Take 150 mg by mouth daily as needed for heartburn.     Historical Provider, MD  zaleplon (SONATA) 10 MG capsule Take 10 mg by mouth at bedtime.  02/21/15   Historical Provider, MD   BP 142/99 mmHg  Pulse 104  Temp(Src) 98.1 F (36.7 C) (Oral)  Resp 21  Ht  (1.803 m)  Wt 240 lb (108.863 kg)  BMI 33.49 kg/m2  SpO2 98%  Vital signs normal except tachycaria  Physical Exam  Constitutional: He is oriented to person, place, and time. He appears well-developed and well-nourished.  Non-toxic appearance. He does not appear ill. No distress.  HENT:  Head: Normocephalic and atraumatic.  Right Ear:  External ear normal.  Left Ear: External ear normal.  Nose: Nose normal. No mucosal edema or rhinorrhea.  Mouth/Throat: Oropharynx is clear and moist and mucous membranes are normal. No dental abscesses or uvula swelling.  Eyes: Conjunctivae and EOM are normal. Pupils are equal, round, and reactive to light.  Neck: Normal range of motion and full passive range of motion without pain. Neck supple.  Cardiovascular: Normal rate, regular rhythm and normal heart sounds.  Exam reveals no gallop and no friction rub.   No murmur heard. Pulmonary/Chest: Effort normal and breath sounds normal. No respiratory distress. He has no wheezes. He has no rhonchi. He has no rales. He exhibits no tenderness and no  crepitus.  Abdominal: Soft. Normal appearance and bowel sounds are normal. He exhibits no distension. There is no tenderness. There is no rebound and no guarding.    Patient has tenderness on the inferior edge of the right mid rib cage without crepitance.  Musculoskeletal: Normal range of motion. He exhibits no edema or tenderness.  Moves all extremities well.   Neurological: He is alert and oriented to person, place, and time. He has normal strength. No cranial nerve deficit.  Skin: Skin is warm, dry and intact. No rash noted. No erythema. No pallor.  Psychiatric: He has a normal mood and affect. His speech is normal and behavior is normal. His mood appears not anxious.  Nursing note and vitals reviewed.   ED Course  Procedures (including critical care time)  Medications  dexamethasone (DECADRON) injection 10 mg (not administered)  diazepam (VALIUM) tablet 5 mg (not administered)   I discussed with the patient that he should not use his inhaler unless he thinks he is wheezing. I also not do not believe he took a whole bottle of ibuprofen today.  Patient thinks he has a blood clot. D-dimer was ordered. I initially ordered Decadron 10 mg IM with Robaxin 1000 mg orally. Patient states "I can't take any muscle relaxers". He list off Soma and Flexeril as being muscle relaxers he cannot take, when I stated I ordered him methocarbamol he goes "I can't take that either". I then offered to give him Valium IM for a muscle relaxer which he refused. He states "I have that at home". Patient starts asking specifically for narcotics. He asked me at least 5 times in various different ways that he wanted narcotic pain medication, including asking for cough syrup. I told him at this point narcotic pain medications were not indicated.  5:15 AM nurse reports patient has eloped.  Review of the [place] database shows patient gets #90 lorazepam 1 mg tablets monthly and #60 zaleplon monthly prescribed by  Stephanie CoupGlenn Combs in [place]. He also got hydrocodone cough syrup in February and in March from 2 different PAs.   Labs Review Labs Reviewed  D-DIMER, QUANTITATIVE (NOT AT [facility])  I-STAT TROPOININ, ED    Imaging Review  Chest x-ray two-view was ordered  No results found. I have personally reviewed and evaluated these images and lab results as part of my medical decision-making.   EKG Interpretation None      MDM   Final diagnoses:  Pleuritic chest pain    Pt left AMA  Devoria AlbeIva Aylinn Rydberg, MD, Concha PyoFACEP     Malay Fantroy, MD 01/05/16 0530

## 2016-01-18 ENCOUNTER — Other Ambulatory Visit: Payer: Self-pay | Admitting: Family Medicine

## 2016-01-18 DIAGNOSIS — R222 Localized swelling, mass and lump, trunk: Secondary | ICD-10-CM

## 2016-01-21 ENCOUNTER — Ambulatory Visit
Admission: RE | Admit: 2016-01-21 | Discharge: 2016-01-21 | Disposition: A | Discharge status: Home / Self Care | Payer: BLUE CROSS/BLUE SHIELD | Source: Ambulatory Visit | Attending: Family Medicine | Admitting: Family Medicine

## 2016-01-21 DIAGNOSIS — R222 Localized swelling, mass and lump, trunk: Secondary | ICD-10-CM

## 2016-01-21 MED ORDER — IOPAMIDOL (ISOVUE-300) INJECTION 61%
75.0000 mL | Freq: Once | INTRAVENOUS | Status: AC | PRN
  Administered 2016-01-21: 75 mL via INTRAVENOUS

## 2016-08-14 ENCOUNTER — Encounter (HOSPITAL_COMMUNITY): Payer: Self-pay

## 2016-08-14 DIAGNOSIS — J45909 Unspecified asthma, uncomplicated: Secondary | ICD-10-CM | POA: Insufficient documentation

## 2016-08-14 DIAGNOSIS — M5432 Sciatica, left side: Secondary | ICD-10-CM | POA: Diagnosis not present

## 2016-08-14 DIAGNOSIS — F17210 Nicotine dependence, cigarettes, uncomplicated: Secondary | ICD-10-CM | POA: Diagnosis not present

## 2016-08-14 DIAGNOSIS — M79605 Pain in left leg: Secondary | ICD-10-CM | POA: Diagnosis present

## 2016-08-14 NOTE — ED Triage Notes (Signed)
Pt states he has experienced left leg pain with numbness since mid October; pt states pain is worse when lying down; Pt states he noticed some swelling to left ankle; Pt c/o pain at 7/10 on arrival. Pt able to ambulate; Pt denies SOB; pt a&o x 4 on arrival.  Pt states he was making long driving trips back in October when pain started. Pt denies hx of blood clots; pt is an everyday smoker

## 2016-08-15 ENCOUNTER — Emergency Department (HOSPITAL_COMMUNITY)
Admission: EM | Admit: 2016-08-15 | Discharge: 2016-08-15 | Disposition: A | Discharge status: Home / Self Care | Payer: BLUE CROSS/BLUE SHIELD | Attending: Emergency Medicine | Admitting: Emergency Medicine

## 2016-08-15 ENCOUNTER — Ambulatory Visit (HOSPITAL_COMMUNITY): Payer: BLUE CROSS/BLUE SHIELD | Attending: Emergency Medicine

## 2016-08-15 DIAGNOSIS — M5432 Sciatica, left side: Secondary | ICD-10-CM

## 2016-08-15 MED ORDER — PREDNISONE 10 MG (21) PO TBPK: 10.0000 mg | ORAL_TABLET | Freq: Every day | ORAL | 0 refills | Status: DC

## 2016-08-15 MED ORDER — HYDROCODONE-ACETAMINOPHEN 5-325 MG PO TABS: 1.0000 | ORAL_TABLET | Freq: Four times a day (QID) | ORAL | 0 refills | Status: AC | PRN

## 2016-08-15 NOTE — ED Provider Notes (Signed)
By signing my name below, I, Linna DarnerRussell Turner, attest that this documentation has been prepared under the direction and in the presence of physician practitioner, Layla MawKristen N Ward, DO. Electronically Signed: Linna Darnerussell Turner, Scribe. 08/15/2016. 3:23 AM.  TIME SEEN: 3:23 AM  CHIEF COMPLAINT: Leg Pain  HPI: HPI Comments: Eddie Gross is a 40 y.o. male who presents to the Emergency Department complaining of constant, worsening, sharp, shooting, left lower extremity pain for the last two months. He states the pain has worsened and become severe over the last several days. 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 He states the pain intermittently shoots into his left toes, left calf, left thigh, and left buttock. He reports numbness/tingling of his left calf as well as some pain in his lower back since onset.  Denies any current chest pain or shortness of breath. No known injury or trauma to his left lower extremity. No h/o blood clot. He denies bowel/bladder incontinence, difficulty urinating, focal weakness, or any other associated symptoms. He intends to follow up with his PCP as soon as he can make an appointment.  PCP: Dr. Modesto CharonWong  ROS: See HPI Constitutional: no fever  Eyes: no drainage  ENT: no runny nose   Cardiovascular: no chest pain Resp: no SOB  GI: no vomiting GU: no dysuria Integumentary: no rash  Allergy: no hives  Musculoskeletal: no leg swelling  Neurological: no slurred speech ROS otherwise negative  PAST MEDICAL HISTORY/PAST SURGICAL HISTORY:  Past Medical History:  Diagnosis Date  . Acid reflux   . Allergy    RHINITIS  . Anxiety   . Asthma   . GERD (gastroesophageal reflux disease)   . Hyperlipidemia   . Insomnia   . Pre-diabetes     MEDICATIONS:  Prior to Admission medications   Medication Sig Start Date End Date Taking? Authorizing Provider  LORazepam (ATIVAN) 1 MG tablet Take 1 mg by mouth 3  (three) times daily.    Yes Historical Provider, MD  zaleplon (SONATA) 10 MG capsule Take 10 mg by mouth at bedtime.  02/21/15  Yes Historical Provider, MD  albuterol (PROVENTIL HFA;VENTOLIN HFA) 108 (90 Base) MCG/ACT inhaler Inhale 4 puffs into the lungs every 6 (six) hours as needed for wheezing. Reported on 11/02/2015 Patient not taking: Reported on 08/15/2016 11/15/15   Chelle Jeffery, PA-C  benzonatate (TESSALON) 100 MG capsule Take 1 capsule (100 mg total) by mouth every 8 (eight) hours. Patient not taking: Reported on 08/15/2016 12/19/15   Tomasita CrumbleAdeleke Oni, MD    ALLERGIES:  Allergies  Allergen Reactions  . Cyclobenzaprine Nausea And Vomiting  . Prochlorperazine Rash    SOCIAL HISTORY:  Social History  Substance Use Topics  . Smoking status: Current Every Day Smoker    Packs/day: 1.00    Years: 29.00    Types: Cigarettes  . Smokeless tobacco: Not on file  . Alcohol use No    FAMILY HISTORY: Family History  Problem Relation Age of Onset  . Heart disease Father     CORNARY HEART DISEASE/ 2 BYPASS SURGERIES  . Cancer Father     PROSTATE  . Heart disease Paternal Grandfather     CORNARY HEART DISEASE  . Arthritis Paternal Grandfather     EXAM: BP 131/87 (BP Location: Left Arm)   Pulse 107   Temp 97.7 F (36.5 C) (Oral)   Resp 18   Ht 5\' 11"  (1.803 m)  Wt 225 lb (102.1 kg)   SpO2 98%   BMI 31.38 kg/m  CONSTITUTIONAL: Alert and oriented and responds appropriately to questions. Well-appearing; well-nourished HEAD: Normocephalic EYES: Conjunctivae clear, PERRL, EOMI ENT: normal nose; no rhinorrhea; moist mucous membranes NECK: Supple, no meningismus, no nuchal rigidity, no LAD  CARD: RRR; S1 and S2 appreciated; no murmurs, no clicks, no rubs, no gallops RESP: Normal chest excursion without splinting or tachypnea; breath sounds clear and equal bilaterally; no wheezes, no rhonchi, no rales, no hypoxia or respiratory distress, speaking full sentences ABD/GI: Normal bowel  sounds; non-distended; soft, non-tender, no rebound, no guarding, no peritoneal signs, no hepatosplenomegaly BACK:  The back appears normal and is non-tender to palpation, there is no CVA tenderness, No midline spinal tenderness or step-off or deformity EXT: Normal ROM in all joints; mildly tender to palpation throughout the left posterior calf without obvious swelling, compartments are soft, no bony tenderness or bony deformity. No tenderness throughout the rest of his extremities. No joint effusions.; no edema; normal capillary refill; no cyanosis, no right-sided calf tenderness or swelling; 2+ DP pulses bilaterally SKIN: Normal color for age and race; warm; no rash NEURO: Moves all extremities equally, sensation to light touch intact diffusely, cranial nerves II through XII intact, normal speech, strength 5/5 in all 4 extremities, normal gait PSYCH: The patient's mood and manner are appropriate. Grooming and personal hygiene are appropriate.  MEDICAL DECISION MAKING: Patient here with left-sided calf pain, numbness, shooting pains in this leg. Does have some back pain but no midline spinal tenderness on exam. No red flag symptoms to suggest cauda equina, epidural abscess or hematoma, discitis or transverse myelitis. No injury to his back or leg. He is concerned about a blood clot given his history of recent long travel. He will come back in the morning for an ultrasound to rule out DVT. My suspicion is low. I do not feel this time he needs prophylactic Lovenox. No chest pain or shortness of breath. Nothing on exam to suggest septic arthritis, gout, compartment syndrome. He is neurovascular intact distally with good strong pulses and a warm and well-perfused extremity. Discussed with patient I suspect this is more likely sciatica given he does have some intermittent back pain. Have recommended follow-up with his PCP. We'll discharge with pain medication and a steroid taper if his venous Doppler is negative.  Doubt fracture given no history of injury and no bony tenderness or bony deformity on exam. Discussed return precautions. He verbalizes understanding and is comfortable with this plan.   I personally performed the services described in this documentation, which was scribed in my presence. The recorded information has been reviewed and is accurate.    Layla MawKristen N Ward, DO 08/15/16 [phone]

## 2016-08-15 NOTE — Discharge Instructions (Signed)
Please come back in the morning for an ultrasound of your left leg to rule out a blood clot. If you do not have a blood clot, I recommend you start taking the prednisone taper as this may be sciatica. Please follow-up with your primary care physician for further management.

## 2016-08-15 NOTE — ED Notes (Signed)
Pt ambulated around hallway unassisted. Tolerated well.

## 2016-09-03 ENCOUNTER — Emergency Department (HOSPITAL_BASED_OUTPATIENT_CLINIC_OR_DEPARTMENT_OTHER): Payer: BLUE CROSS/BLUE SHIELD

## 2016-09-03 ENCOUNTER — Encounter (HOSPITAL_BASED_OUTPATIENT_CLINIC_OR_DEPARTMENT_OTHER): Payer: Self-pay | Admitting: *Deleted

## 2016-09-03 ENCOUNTER — Emergency Department (HOSPITAL_BASED_OUTPATIENT_CLINIC_OR_DEPARTMENT_OTHER)
Admission: EM | Admit: 2016-09-03 | Discharge: 2016-09-04 | Disposition: A | Discharge status: Home / Self Care | Payer: BLUE CROSS/BLUE SHIELD | Attending: Emergency Medicine | Admitting: Emergency Medicine

## 2016-09-03 DIAGNOSIS — Z79899 Other long term (current) drug therapy: Secondary | ICD-10-CM | POA: Insufficient documentation

## 2016-09-03 DIAGNOSIS — R091 Pleurisy: Secondary | ICD-10-CM | POA: Insufficient documentation

## 2016-09-03 DIAGNOSIS — R079 Chest pain, unspecified: Secondary | ICD-10-CM

## 2016-09-03 DIAGNOSIS — R0602 Shortness of breath: Secondary | ICD-10-CM | POA: Diagnosis present

## 2016-09-03 DIAGNOSIS — F17210 Nicotine dependence, cigarettes, uncomplicated: Secondary | ICD-10-CM | POA: Insufficient documentation

## 2016-09-03 DIAGNOSIS — J45909 Unspecified asthma, uncomplicated: Secondary | ICD-10-CM | POA: Insufficient documentation

## 2016-09-03 LAB — CBC
HCT: 41.2 % (ref 39.0–52.0)
HEMOGLOBIN: 14.6 g/dL (ref 13.0–17.0)
MCH: 33 pg (ref 26.0–34.0)
MCHC: 35.4 g/dL (ref 30.0–36.0)
MCV: 93.2 fL (ref 78.0–100.0)
Platelets: 200 10*3/uL (ref 150–400)
RBC: 4.42 MIL/uL (ref 4.22–5.81)
RDW: 12.9 % (ref 11.5–15.5)
WBC: 10.3 10*3/uL (ref 4.0–10.5)

## 2016-09-03 LAB — COMPREHENSIVE METABOLIC PANEL
ALBUMIN: 4 g/dL (ref 3.5–5.0)
ALK PHOS: 76 U/L (ref 38–126)
ALT: 31 U/L (ref 17–63)
ANION GAP: 7 (ref 5–15)
AST: 24 U/L (ref 15–41)
BUN: 14 mg/dL (ref 6–20)
CALCIUM: 9 mg/dL (ref 8.9–10.3)
CO2: 24 mmol/L (ref 22–32)
CREATININE: 1 mg/dL (ref 0.61–1.24)
Chloride: 105 mmol/L (ref 101–111)
GFR calc Af Amer: >60 mL/min (ref >60)
GFR calc non Af Amer: >60 mL/min (ref >60)
GLUCOSE: 101 mg/dL — AB (ref 65–99)
Potassium: 4.2 mmol/L (ref 3.5–5.1)
SODIUM: 136 mmol/L (ref 135–145)
Total Bilirubin: 0.5 mg/dL (ref 0.3–1.2)
Total Protein: 6.6 g/dL (ref 6.5–8.1)

## 2016-09-03 LAB — D-DIMER, QUANTITATIVE: D-Dimer, Quant: 0.31 ug/mL-FEU (ref 0.00–0.50)

## 2016-09-03 LAB — TROPONIN I: Troponin I: <0.03 ng/mL (ref <0.03)

## 2016-09-03 NOTE — ED Notes (Signed)
Family at bedside. 

## 2016-09-03 NOTE — ED Provider Notes (Signed)
[facility] Note   CSN: [id number] Arrival date & time: 09/03/16  2142 By signing my name below, I, Eddie Gross, attest that this documentation has been prepared under the direction and in the presence of Nira Conn, MD . Electronically Signed: Levon Gross, Scribe. 09/03/2016. 11:10 PM.   History   Chief Complaint Chief Complaint  Patient presents with  . Shortness of Breath    HPI Eddie Gross is a 40 y.o. male with a hx of asthma and GERD who presents to the Emergency Department complaining of persistent shortness of breath onset last night while driving. Per pt, he is unable to take a full breath due to associated tightness to his left chest. He also notes associated left lateral back pain, dry cough which began last night after onset of pain, and pain to his LLE which he describes as sharp and shooting. He has taken some cough syrup with no relief of cough. 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  He is a current smoker, and states he is trying to quit. He reports that his father had an MI at 66.Pt denies any lower extremity swelling.   The history is provided by the patient. No language interpreter was used.   Past Medical History:  Diagnosis Date  . Acid reflux   . Allergy    RHINITIS  . Anxiety   . Asthma   . GERD (gastroesophageal reflux disease)   . Hyperlipidemia   . Insomnia   . Pre-diabetes     Patient Active Problem List   Diagnosis Date Noted  . Hypertriglyceridemia 02/04/2014  . 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 Pre-diabetes   . Acid reflux   . Insomnia    History reviewed. No pertinent surgical history.   Home Medications    Prior to Admission medications   Medication Sig Start Date End Date Taking? Authorizing Provider  LORazepam (ATIVAN) 1 MG tablet Take 1 mg by mouth 3 (three) times daily.    Yes Historical Provider, MD  zaleplon (SONATA) 10 MG capsule Take  10 mg by mouth at bedtime.  02/21/15  Yes Historical Provider, MD  HYDROcodone-acetaminophen (NORCO/VICODIN) 5-325 MG tablet Take 1-2 tablets by mouth every 6 (six) hours as needed. 08/15/16   Kristen N Ward, DO  predniSONE (STERAPRED UNI-PAK 21 TAB) 10 MG (21) TBPK tablet Take 1 tablet (10 mg total) by mouth daily. Take as directed 08/15/16   Layla Maw Ward, DO    Family History Family History  Problem Relation Age of Onset  . Heart disease Father     CORNARY HEART DISEASE/ 2 BYPASS SURGERIES  . Cancer Father     PROSTATE  . Heart disease Paternal Grandfather     CORNARY HEART DISEASE  . Arthritis Paternal Grandfather     Social History Social History  Substance Use Topics  . Smoking status: Current Every Day Smoker    Packs/day: 1.00    Years: 29.00    Types: Cigarettes  . Smokeless tobacco: Never Used  . Alcohol use No    Allergies   Cyclobenzaprine and Prochlorperazine  Review of Systems Review of Systems 10 systems reviewed and all are negative for acute change except as noted in the HPI.  Physical Exam Updated Vital Signs BP 115/77   Pulse 62   Temp 97.7 F (36.5 C) (Oral)   Resp 18   Ht 5\' 11"  (1.803 m)   Wt 225  lb (102.1 kg)   SpO2 97%   BMI 31.38 kg/m   Physical Exam  Constitutional: He is oriented to person, place, and time. He appears well-developed and well-nourished. No distress.  HENT:  Head: Normocephalic and atraumatic.  Nose: Nose normal.  Eyes: Conjunctivae and EOM are normal. Pupils are equal, round, and reactive to light. Right eye exhibits no discharge. Left eye exhibits no discharge. No scleral icterus.  Neck: Normal range of motion. Neck supple.  Cardiovascular: Normal rate and regular rhythm.  Exam reveals no gallop and no friction rub.   No murmur heard. Pulmonary/Chest: Effort normal and breath sounds normal. No stridor. No respiratory distress. He has no rales.  Abdominal: Soft. He exhibits no distension. There is no tenderness.    Musculoskeletal: He exhibits no edema or tenderness.  Neurological: He is alert and oriented to person, place, and time.  Skin: Skin is warm and dry. No rash noted. He is not diaphoretic. No erythema.  Psychiatric: He has a normal mood and affect.  Vitals reviewed.    ED Treatments / Results  DIAGNOSTIC STUDIES:  Oxygen Saturation is 97% on RA, normal by my interpretation.    COORDINATION OF CARE:  11:06 PM Will order Troponin 1, CMP, CBC, and D-dimer.  Discussed treatment plan with pt at bedside and pt agreed to plan.   Labs (all labs ordered are listed, but only abnormal results are displayed) Labs Reviewed - No data to display  EKG  EKG Interpretation None       Radiology Dg Chest 2 View  Result Date: 09/03/2016 CLINICAL DATA:  Shortness of breath and left lower lung soreness. EXAM: CHEST  2 VIEW COMPARISON:  CT of the chest 01/21/2016 FINDINGS: The heart size and mediastinal contours are within normal limits. Both lungs are clear. The visualized skeletal structures are unremarkable. IMPRESSION: No active cardiopulmonary disease. Electronically Signed   By: Ted Mcalpineobrinka  Dimitrova M.D.   On: 09/03/2016 22:35    Procedures Procedures (including critical care time)  Medications Ordered in ED Medications - No data to display   Initial Impression / Assessment and Plan / ED Course  I have reviewed the triage vital signs and the nursing notes.  Pertinent labs & imaging results that were available during my care of the patient were reviewed by me and considered in my medical decision making (see chart for details).  Clinical Course as of Sep 04 5  Wynelle LinkSun Sep 03, 2016  2343 Atypical chest pain most consistent with pleurisy. Highly inconsistent with ACS. EKG without acute ischemic changes or evidence of pericarditis. Chest x-ray without evidence suggestive of pneumonia, pneumothorax, pneumomediastinum.  No abnormal contour of the mediastinum to suggest dissection. No evidence  of acute injuries. HEAR <4. Initial trop negative. Given the fact that pt's pain has been on going for >24 hrs, I feel that this negative troponin has effectively ruled out ACS.  Given history of long car trips, there is a concern for possible pulmonary embolism. D-dimer negative effectively ruling out pulmonary embolism.  Presentation not classic for aortic dissection or esophageal perforation.   [PC]  Mon Sep 04, 2016  0006 Other labs reassuring   [PC]  0006 The patient is safe for discharge with strict return precautions.   [PC]    Clinical Course User Index [PC] Nira ConnPedro Eduardo Holman Bonsignore, MD      Final Clinical Impressions(s) / ED Diagnoses   Final diagnoses:  SOB (shortness of breath)   Disposition: Discharge  Condition: Good  I  have discussed the results, Dx and Tx plan with the patient who expressed understanding and agree(s) with the plan. Discharge instructions discussed at great length. The patient was given strict return precautions who verbalized understanding of the instructions. No further questions at time of discharge.    New Prescriptions   IBUPROFEN (ADVIL,MOTRIN) 600 MG TABLET    Take 1 tablet (600 mg total) by mouth every 6 (six) hours as needed.    Follow Up: Ileana LaddFrancis P Wong, MD [address] [place] [id number] [phone]  Schedule an appointment as soon as possible for a visit  in 3-5 days, If symptoms do not improve or  worsen   I personally performed the services described in this documentation, which was scribed in my presence. The recorded information has been reviewed and is accurate.        Nira ConnPedro Eduardo Tranise Forrest, MD 09/04/16 Burna Mortimer0010

## 2016-09-03 NOTE — ED Triage Notes (Signed)
Last night he felt SOB in his left lung. It has continued. Dry and tightness. Hx of bronchitis. Dry cough. No distress.

## 2016-09-03 NOTE — ED Notes (Signed)
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  NO swelling in feet or ankles.

## 2016-09-04 MED ORDER — IBUPROFEN 600 MG PO TABS
600.0000 mg | ORAL_TABLET | Freq: Four times a day (QID) | ORAL | 0 refills | Status: AC | PRN
Start: 2016-09-04 — End: ?

## 2017-02-19 ENCOUNTER — Encounter (HOSPITAL_COMMUNITY): Payer: Self-pay

## 2017-02-19 ENCOUNTER — Emergency Department (HOSPITAL_COMMUNITY): Payer: BLUE CROSS/BLUE SHIELD

## 2017-02-19 ENCOUNTER — Emergency Department (HOSPITAL_COMMUNITY)
Admission: EM | Admit: 2017-02-19 | Discharge: 2017-02-19 | Disposition: A | Discharge status: Home / Self Care | Payer: BLUE CROSS/BLUE SHIELD | Attending: Emergency Medicine | Admitting: Emergency Medicine

## 2017-02-19 DIAGNOSIS — J449 Chronic obstructive pulmonary disease, unspecified: Secondary | ICD-10-CM | POA: Diagnosis not present

## 2017-02-19 DIAGNOSIS — M25512 Pain in left shoulder: Secondary | ICD-10-CM | POA: Insufficient documentation

## 2017-02-19 DIAGNOSIS — F17210 Nicotine dependence, cigarettes, uncomplicated: Secondary | ICD-10-CM | POA: Diagnosis not present

## 2017-02-19 DIAGNOSIS — Z79899 Other long term (current) drug therapy: Secondary | ICD-10-CM | POA: Diagnosis not present

## 2017-02-19 DIAGNOSIS — J45909 Unspecified asthma, uncomplicated: Secondary | ICD-10-CM | POA: Diagnosis not present

## 2017-02-19 LAB — BASIC METABOLIC PANEL
Anion gap: 9 (ref 5–15)
BUN: 18 mg/dL (ref 6–20)
CHLORIDE: 103 mmol/L (ref 101–111)
CO2: 24 mmol/L (ref 22–32)
CREATININE: 1.21 mg/dL (ref 0.61–1.24)
Calcium: 9.2 mg/dL (ref 8.9–10.3)
Glucose, Bld: 127 mg/dL — ABNORMAL HIGH (ref 65–99)
POTASSIUM: 4 mmol/L (ref 3.5–5.1)
SODIUM: 136 mmol/L (ref 135–145)

## 2017-02-19 LAB — I-STAT TROPONIN, ED: Troponin i, poc: 0 ng/mL (ref 0.00–0.08)

## 2017-02-19 LAB — CBC
HEMATOCRIT: 43.9 % (ref 39.0–52.0)
Hemoglobin: 15.5 g/dL (ref 13.0–17.0)
MCH: 32.8 pg (ref 26.0–34.0)
MCHC: 35.3 g/dL (ref 30.0–36.0)
MCV: 92.8 fL (ref 78.0–100.0)
PLATELETS: 202 10*3/uL (ref 150–400)
RBC: 4.73 MIL/uL (ref 4.22–5.81)
RDW: 13.2 % (ref 11.5–15.5)
WBC: 9.9 10*3/uL (ref 4.0–10.5)

## 2017-02-19 MED ORDER — KETOROLAC TROMETHAMINE 60 MG/2ML IM SOLN
30.0000 mg | Freq: Once | INTRAMUSCULAR | Status: AC
  Administered 2017-02-19: 30 mg via INTRAMUSCULAR
  Filled 2017-02-19: qty 2

## 2017-02-19 NOTE — ED Notes (Signed)
Pt has been taken to xray at this time.

## 2017-02-19 NOTE — ED Notes (Signed)
Pt A&OX4, ambulatory at d/c with independent steady gait, NAD 

## 2017-02-19 NOTE — ED Notes (Signed)
PA Mina at bedside. 

## 2017-02-19 NOTE — ED Provider Notes (Signed)
[facility]-EMERGENCY DEPT Provider Note   CSN: [id number] Arrival date & time: 02/19/17  1629     History   Chief Complaint Chief Complaint  Patient presents with  . Chest Pain    HPI Eddie Gross is a 41 y.o. male with PMHx COPD, pre-diabetes, HLD, and anxiety who presents today with chief complaint acute onset, constant moderate left shoulder pain which began yesterday. Patient states that he was driving at around 5 PM when crushing left shoulder pain began at rest. He notes that it is associated with some increased shortness of breath and chest tightness, which he attributes to "the humidity which makes my COPD worse ". He also endorses cough intermittent productive of dark sputum, which she says is chronic and unchanged. Movement and palpation can make his pain worse. No alleviating factors. He notes that he had some diaphoresis and nausea last night due to pain. He has intermittent numbness and tingling radiating down the left upper extremity into the hand. He has tried over-the-counter Aleve and Advil as well as his albuterol inhaler with mild temporary relief. He is a current smoker. He denies recent travel, surgeries, hemoptysis, or testosterone therapy, no prior history of DVT/PE. He denies any known trauma, but states he lifts a lot at work and has had a rotator cuff injury of the right shoulder in the past. He has never had a catheterization in the past, and had a stress test 10 years ago which was normal.  The history is provided by the patient.    Past Medical History:  Diagnosis Date  . Acid reflux   . Allergy    RHINITIS  . Anxiety   . Asthma   . GERD (gastroesophageal reflux disease)   . Hyperlipidemia   . Insomnia   . Pre-diabetes     Patient Active Problem List   Diagnosis Date Noted  . Hypertriglyceridemia 02/04/2014  . 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 Pre-diabetes   . Acid reflux   . Insomnia     History reviewed. No pertinent surgical  history.     Home Medications    Prior to Admission medications   Medication Sig Start Date End Date Taking? Authorizing Provider  albuterol (PROAIR HFA) 108 (90 Base) MCG/ACT inhaler Inhale 2 puffs into the lungs every 6 (six) hours as needed for wheezing or shortness of breath.   Yes [provider]  aspirin EC 81 MG tablet Take 162-243 mg by mouth every 6 (six) hours as needed (pain/headache).   Yes [provider]  Chlorpheniramine Maleate (CHLORPHEN PO) Take 1 tablet by mouth daily.   Yes [provider]  Cholecalciferol (VITAMIN D3 PO) Take 1 tablet by mouth daily.   Yes [provider]  ibuprofen (ADVIL,MOTRIN) 200 MG tablet Take 600 mg by mouth every 6 (six) hours as needed for headache (pain).   Yes [provider]  LORazepam (ATIVAN) 1 MG tablet Take 1 mg by mouth See admin instructions. Take 1 tablet (1 mg) by mouth daily at bedtime, may also take 1 tablet (1 mg) twice daily as needed for anxiety   Yes [provider]  Multiple Vitamin (MULTIVITAMIN WITH MINERALS) TABS tablet Take 1 tablet by mouth daily.   Yes [provider]  naproxen sodium (ALEVE) 220 MG tablet Take 440 mg by mouth 2 (two) times daily as needed (pain/headache).   Yes [provider]  pseudoephedrine (SUDAFED) 30 MG tablet Take 60 mg by mouth every 4 (four) hours as needed for  congestion.   Yes [provider]  zaleplon (SONATA) 5 MG capsule Take 5 mg by mouth at bedtime.   Yes [provider]  HYDROcodone-acetaminophen (NORCO/VICODIN) 5-325 MG tablet Take 1-2 tablets by mouth every 6 (six) hours as needed. Patient not taking: Reported on 02/19/2017 08/15/16   Ward, Layla Maw, DO  ibuprofen (ADVIL,MOTRIN) 600 MG tablet Take 1 tablet (600 mg total) by mouth every 6 (six) hours as needed. Patient not taking: Reported on 02/19/2017 09/04/16   Nira Conn, MD    Family History Family History  Problem Relation Age of  Onset  . Heart disease Father        CORNARY HEART DISEASE/ 2 BYPASS SURGERIES  . Cancer Father        PROSTATE  . Heart disease Paternal Grandfather        CORNARY HEART DISEASE  . Arthritis Paternal Grandfather     Social History Social History  Substance Use Topics  . Smoking status: Current Every Day Smoker    Packs/day: 1.00    Years: 29.00    Types: Cigarettes  . Smokeless tobacco: Never Used  . Alcohol use No     Allergies   Cyclobenzaprine and Prochlorperazine   Review of Systems Review of Systems  Constitutional: Positive for chills. Negative for fever.  Respiratory: Positive for cough, chest tightness and shortness of breath.   Cardiovascular: Negative for chest pain.  Gastrointestinal: Positive for nausea. Negative for abdominal pain, blood in stool, constipation, diarrhea and vomiting.  Musculoskeletal: Positive for arthralgias.  Neurological: Negative for syncope and light-headedness.     Physical Exam Updated Vital Signs BP 120/79   Pulse 60   Temp 97.5 F (36.4 C) (Oral)   Resp 11   Ht 5\' 11"  (1.803 m)   Wt 103.9 kg (229 lb)   SpO2 99%   BMI 31.94 kg/m   Physical Exam  Constitutional: He is oriented to person, place, and time. He appears well-developed and well-nourished.  HENT:  Head: Normocephalic and atraumatic.  Eyes: Conjunctivae are normal.  Neck: No JVD present. No tracheal deviation present.  Cardiovascular: Normal rate, regular rhythm, normal heart sounds and intact distal pulses.   Distant heart sounds, 2+ radial and DP/PT pulses bl, negative Homan's bl   Pulmonary/Chest: Effort normal and breath sounds normal. No respiratory distress. He has no wheezes. He has no rales. He exhibits no tenderness.  Equal rise and fall of chest, no increased work of breathing  Abdominal: Soft. He exhibits no distension. There is no tenderness.  Musculoskeletal: Normal range of motion. He exhibits tenderness.  Normal active and passive range of  motion of bilateral shoulders. Pain elicited with internal rotation and had reduction of the left shoulder. Mild tenderness to palpation at the anterior bicep into the pectoralis and supraspinatus muscle. Positive empty can sign, negative Neer's, negative Hawkins, no arc of pain. 5/5 strength of BUE with good grip strength  Neurological: He is alert and oriented to person, place, and time. No sensory deficit.  Fluent speech, no facial droop, sensation intact to soft touch of BUE  Skin: Skin is warm and dry. Capillary refill takes less than 2 seconds.  Psychiatric: He has a normal mood and affect. His behavior is normal.     ED Treatments / Results  Labs (all labs ordered are listed, but only abnormal results are displayed) Labs Reviewed  BASIC METABOLIC PANEL - Abnormal; Notable for the following:       Result Value  Glucose, Bld 127 (*)    All other components within normal limits  CBC  I-STAT TROPOININ, ED    EKG  EKG Interpretation  Date/Time:  Monday February 19 2017 16:34:26 EDT Ventricular Rate:  102 PR Interval:  140 QRS Duration: 90 QT Interval:  338 QTC Calculation: 440 R Axis:   82 Text Interpretation:  Sinus tachycardia Otherwise normal ECG Confirmed by Donnetta Hutchingook, Brian ([id number]) on 02/19/2017 10:01:23 PM       Radiology Dg Chest 2 View  Result Date: 02/19/2017 CLINICAL DATA:  Chest pain extending into the left shoulder and all arm starting last night. EXAM: CHEST  2 VIEW COMPARISON:  09/03/2016 FINDINGS: The heart size and mediastinal contours are within normal limits. Both lungs are clear. The visualized skeletal structures are unremarkable. IMPRESSION: No active cardiopulmonary disease. Electronically Signed   By: Gaylyn RongWalter  Liebkemann M.D.   On: 02/19/2017 17:46   Dg Shoulder Left  Result Date: 02/19/2017 CLINICAL DATA:  Left proximal humeral pain on and off since April. EXAM: LEFT SHOULDER - 2+ VIEW COMPARISON:  09/03/2016 CXR which partially includes the left shoulder  FINDINGS: There is no evidence of fracture or dislocation. There is no evidence of arthropathy or other focal bone abnormality. Soft tissues are unremarkable. IMPRESSION: Negative. Electronically Signed   By: Tollie Ethavid  Kwon M.D.   On: 02/19/2017 22:02    Procedures Procedures (including critical care time)  Medications Ordered in ED Medications  ketorolac (TORADOL) injection 30 mg (30 mg Intramuscular Given 02/19/17 2221)     Initial Impression / Assessment and Plan / ED Course  I have reviewed the triage vital signs and the nursing notes.  Pertinent labs & imaging results that were available during my care of the patient were reviewed by me and considered in my medical decision making (see chart for details).      Patient with acute atraumatic pain of left shoulder, Reproducible on palpation and with certain movements. Afebrile, initially tachycardic with resolution while in the ED. Troponin negative, EKG shows sinus tach but no other abnormalities. Heart score 3. Low suspicion of ACS or MI. Low suspicion of PE in the absence of risk factors. Chest x-ray without any active cardio pulmonary disease. Low suspicion of pneumonia, bronchitis, or pleural effusion. I feel this is likely musculoskeletal in nature, possible rotator cuff injury of physical examination. Shoulder x-ray negative for fracture or dislocation. RICE therapy indicated and discussed, recommend follow-up with primary care for reevaluation and possible referral to physical therapy if symptoms persist. Also recommend repeat cardiac workup since it has been sometime since the patient has been evaluated. Discussed indications for return to the ED. Pt verbalized understanding of and agreement with plan and is safe for discharge home at this time.    Final Clinical Impressions(s) / ED Diagnoses   Final diagnoses:  Acute pain of left shoulder    New Prescriptions Discharge Medication List as of 02/19/2017 10:56 PM       Jeanie SewerFawze,  Abia Monaco A, PA-C 02/20/17 1506    Donnetta Hutchingook, Brian, MD 02/26/17 1050

## 2017-02-19 NOTE — Discharge Instructions (Signed)
Alternate 600 mg of ibuprofen and 500 mg of Tylenol every 3 hours as needed for pain. Apply ice or heat to the affected area for comfort. Gently stretch while in the shower. Follow-up with your primary care for reevaluation, possible referral to physical therapy, and for cardiac workup. Return to the ED if any concerning signs or symptoms develop.

## 2017-02-19 NOTE — ED Triage Notes (Signed)
Pt states he began having crushing chest pain in his left shoulder last night. Pain now radiates to the left chest and down the left arm. Skin warm and dry. No acute distress noted.

## 2017-02-19 NOTE — ED Notes (Signed)
Pt has returned from xray

## 2017-07-31 ENCOUNTER — Emergency Department (HOSPITAL_COMMUNITY)
Admission: EM | Admit: 2017-07-31 | Discharge: 2017-07-31 | Disposition: A | Discharge status: Home / Self Care | Payer: 59 | Attending: Emergency Medicine | Admitting: Emergency Medicine

## 2017-07-31 ENCOUNTER — Encounter (HOSPITAL_COMMUNITY): Payer: Self-pay | Admitting: Emergency Medicine

## 2017-07-31 ENCOUNTER — Emergency Department (HOSPITAL_COMMUNITY): Payer: 59

## 2017-07-31 DIAGNOSIS — R05 Cough: Secondary | ICD-10-CM | POA: Diagnosis not present

## 2017-07-31 DIAGNOSIS — R079 Chest pain, unspecified: Secondary | ICD-10-CM | POA: Diagnosis present

## 2017-07-31 DIAGNOSIS — J40 Bronchitis, not specified as acute or chronic: Secondary | ICD-10-CM

## 2017-07-31 DIAGNOSIS — R0981 Nasal congestion: Secondary | ICD-10-CM | POA: Diagnosis not present

## 2017-07-31 DIAGNOSIS — F17210 Nicotine dependence, cigarettes, uncomplicated: Secondary | ICD-10-CM | POA: Diagnosis not present

## 2017-07-31 DIAGNOSIS — Z79899 Other long term (current) drug therapy: Secondary | ICD-10-CM | POA: Insufficient documentation

## 2017-07-31 HISTORY — DX: Malingerer (conscious simulation): Z76.5

## 2017-07-31 LAB — BASIC METABOLIC PANEL
Anion gap: 7 (ref 5–15)
BUN: 8 mg/dL (ref 6–20)
CALCIUM: 8.9 mg/dL (ref 8.9–10.3)
CO2: 25 mmol/L (ref 22–32)
CREATININE: 1.13 mg/dL (ref 0.61–1.24)
Chloride: 104 mmol/L (ref 101–111)
GFR calc Af Amer: >60 mL/min (ref >60)
Glucose, Bld: 162 mg/dL — ABNORMAL HIGH (ref 65–99)
Potassium: 3.8 mmol/L (ref 3.5–5.1)
SODIUM: 136 mmol/L (ref 135–145)

## 2017-07-31 LAB — CBC
HEMATOCRIT: 40.8 % (ref 39.0–52.0)
Hemoglobin: 14.4 g/dL (ref 13.0–17.0)
MCH: 32.6 pg (ref 26.0–34.0)
MCHC: 35.3 g/dL (ref 30.0–36.0)
MCV: 92.3 fL (ref 78.0–100.0)
Platelets: 189 10*3/uL (ref 150–400)
RBC: 4.42 MIL/uL (ref 4.22–5.81)
RDW: 12.8 % (ref 11.5–15.5)
WBC: 9.5 10*3/uL (ref 4.0–10.5)

## 2017-07-31 LAB — I-STAT TROPONIN, ED: Troponin i, poc: 0 ng/mL (ref 0.00–0.08)

## 2017-07-31 MED ORDER — BENZONATATE 100 MG PO CAPS: 100.0000 mg | ORAL_CAPSULE | Freq: Three times a day (TID) | ORAL | 0 refills | Status: AC

## 2017-07-31 MED ORDER — DOXYCYCLINE HYCLATE 100 MG PO CAPS: 100.0000 mg | ORAL_CAPSULE | Freq: Two times a day (BID) | ORAL | 0 refills | Status: AC

## 2017-07-31 NOTE — ED Provider Notes (Signed)
[facility] Provider Note   CSN: [id number] Arrival date & time: 07/31/17  2006     History   Chief Complaint Chief Complaint  Patient presents with  . Chest Pain    HPI Glyn AdeJoseph E Carelli is a 41 y.o. male.  The history is provided by the patient and medical records. No language interpreter was used.  Chest Pain   Associated symptoms include cough. Pertinent negatives include no fever.   Glyn AdeJoseph E Crescenzo is a 41 y.o. male  with a PMH of asthma, HLD, GERD, anxiety who presents to the Emergency Department complaining of productive cough and nasal congestion x 1 week. He has tried albuterol inhaler and Mucinex with little relief. He endorses central chest discomfort only with coughing over the last week. No shortness of breath, abdominal pain, nausea, vomiting, diarrhea, fever, chills, headaches, diaphoresis.  Patient states that he gets this same illness once a year around this time and the only thing that improves his symptoms are antibiotics and hydrocodone cough syrup. + daily smoker.   Past Medical History:  Diagnosis Date  . Acid reflux   . Allergy    RHINITIS  . Anxiety   . Asthma   . GERD (gastroesophageal reflux disease)   . Hyperlipidemia   . Insomnia   . Pre-diabetes     Patient Active Problem List   Diagnosis Date Noted  . Hypertriglyceridemia 02/04/2014  . 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 Pre-diabetes   . Acid reflux   . Insomnia     History reviewed. No pertinent surgical history.     Home Medications    Prior to Admission medications   Medication Sig Start Date End Date Taking? Authorizing Provider  albuterol (PROAIR HFA) 108 (90 Base) MCG/ACT inhaler Inhale 2 puffs into the lungs every 6 (six) hours as needed for wheezing or shortness of breath.    [provider]  aspirin EC 81 MG tablet Take 162-243 mg by mouth every 6 (six) hours as needed (pain/headache).    [provider]  benzonatate  (TESSALON) 100 MG capsule Take 1 capsule (100 mg total) by mouth every 8 (eight) hours. 07/31/17   Lacy Taglieri, Chase PicketJaime Pilcher, PA-C  Chlorpheniramine Maleate (CHLORPHEN PO) Take 1 tablet by mouth daily.    [provider]  Cholecalciferol (VITAMIN D3 PO) Take 1 tablet by mouth daily.    [provider]  doxycycline (VIBRAMYCIN) 100 MG capsule Take 1 capsule (100 mg total) by mouth 2 (two) times daily. 07/31/17   Alazar Cherian, Chase PicketJaime Pilcher, PA-C  HYDROcodone-acetaminophen (NORCO/VICODIN) 5-325 MG tablet Take 1-2 tablets by mouth every 6 (six) hours as needed. Patient not taking: Reported on 02/19/2017 08/15/16   Ambriana Selway, Layla MawKristen N, DO  ibuprofen (ADVIL,MOTRIN) 200 MG tablet Take 600 mg by mouth every 6 (six) hours as needed for headache (pain).    [provider]  ibuprofen (ADVIL,MOTRIN) 600 MG tablet Take 1 tablet (600 mg total) by mouth every 6 (six) hours as needed. Patient not taking: Reported on 02/19/2017 09/04/16   Nira Connardama, Pedro Eduardo, MD  LORazepam (ATIVAN) 1 MG tablet Take 1 mg by mouth See admin instructions. Take 1 tablet (1 mg) by mouth daily at bedtime, may also take 1 tablet (1 mg) twice daily as needed for anxiety    [provider]  Multiple Vitamin (MULTIVITAMIN WITH MINERALS) TABS tablet Take 1 tablet by mouth daily.    [provider]  naproxen sodium (ALEVE) 220 MG tablet Take 440 mg  by mouth 2 (two) times daily as needed (pain/headache).    [provider]  pseudoephedrine (SUDAFED) 30 MG tablet Take 60 mg by mouth every 4 (four) hours as needed for congestion.    [provider]  zaleplon (SONATA) 5 MG capsule Take 5 mg by mouth at bedtime.    [provider]    Family History Family History  Problem Relation Age of Onset  . Heart disease Father        CORNARY HEART DISEASE/ 2 BYPASS SURGERIES  . Cancer Father        PROSTATE  . Heart disease Paternal Grandfather        CORNARY HEART DISEASE  . Arthritis Paternal  Grandfather     Social History Social History   Tobacco Use  . Smoking status: Current Every Day Smoker    Packs/day: 1.00    Years: 29.00    Pack years: 29.00    Types: Cigarettes  . Smokeless tobacco: Never Used  Substance Use Topics  . Alcohol use: No    Alcohol/week: 0.0 oz  . Drug use: No     Allergies   Cyclobenzaprine and Prochlorperazine   Review of Systems Review of Systems  Constitutional: Positive for fatigue. Negative for chills and fever.  HENT: Positive for congestion and sore throat. Negative for ear pain and trouble swallowing.   Respiratory: Positive for cough.   Cardiovascular: Positive for chest pain.  All other systems reviewed and are negative.    Physical Exam Updated Vital Signs BP 135/77 (BP Location: Right Arm)   Pulse 87   Temp 98.7 F (37.1 C) (Oral)   Resp 16   Ht 5\' 11"  (1.803 m)   Wt 102.1 kg (225 lb)   SpO2 99%   BMI 31.38 kg/m   Physical Exam  Constitutional: He is oriented to person, place, and time. He appears well-developed and well-nourished. No distress.  HENT:  Head: Normocephalic and atraumatic.  OP with erythema, no exudates or tonsillar hypertrophy. + nasal congestion with mucosal edema.   Neck: Normal range of motion. Neck supple.  No meningeal signs.   Cardiovascular: Normal rate, regular rhythm and normal heart sounds.  Pulmonary/Chest: Effort normal.  Lungs are clear to auscultation bilaterally - no w/r/r  Abdominal: Soft. He exhibits no distension. There is no tenderness.  Musculoskeletal: Normal range of motion.  Neurological: He is alert and oriented to person, place, and time.  Skin: Skin is warm and dry. He is not diaphoretic.  Nursing note and vitals reviewed.    ED Treatments / Results  Labs (all labs ordered are listed, but only abnormal results are displayed) Labs Reviewed  BASIC METABOLIC PANEL - Abnormal; Notable for the following components:      Result Value   Glucose, Bld 162 (*)    All  other components within normal limits  CBC  I-STAT TROPONIN, ED    EKG  EKG Interpretation None       Radiology Dg Chest 2 View  Result Date: 07/31/2017 CLINICAL DATA:  Pt c/o productive cough, SOB, and generalized chest pain x 2 weeks. Pt states he is coughing up green and brown phlegm. Hx of asthma. Pt is a current smoker. EXAM: CHEST  2 VIEW COMPARISON:  02/19/2017 FINDINGS: The heart size and mediastinal contours are within normal limits. Both lungs are clear. Mild lower thoracic spondylosis. No pleural effusion. IMPRESSION: No active cardiopulmonary disease. Electronically Signed   By: Gaylyn Rong M.D.   On:  07/31/2017 21:01    Procedures Procedures (including critical care time)  Medications Ordered in ED Medications - No data to display   Initial Impression / Assessment and Plan / ED Course  I have reviewed the triage vital signs and the nursing notes.  Pertinent labs & imaging results that were available during my care of the patient were reviewed by me and considered in my medical decision making (see chart for details).    Glyn AdeJoseph E Harrington is a 41 y.o. male who presents to ED for cough, congestion x 1 week.   On exam, patient is afebrile, non-toxic appearing with a clear lung exam. Mild rhinorrhea and OP with erythema but no exudates or tonsillar hypertrophy.  CXR negative.  Labs obtained in triage reviewed and reassuring.   Patient is a daily smoker. Symptomatic home care instructions discussed. Rx for doxycline given. Patient upset that I did not provide hydrocodone cough syrup, but I did provide rx for tessalon PRN cough. PCP follow up strongly encouraged if symptoms persist. Reasons to return to ER discussed. All questions answered.   Blood pressure 135/77, pulse 87, temperature 98.7 F (37.1 C), temperature source Oral, resp. rate 16, height 5\' 11"  (1.803 m), weight 102.1 kg (225 lb), SpO2 99 %.   Final Clinical Impressions(s) / ED Diagnoses   Final  diagnoses:  Bronchitis    ED Discharge Orders        Ordered    doxycycline (VIBRAMYCIN) 100 MG capsule  2 times daily     07/31/17 2334    benzonatate (TESSALON) 100 MG capsule  Every 8 hours     07/31/17 2336       Chelesea Weiand, Chase PicketJaime Pilcher, PA-C 07/31/17 2346    Palumbo, April, MD 08/01/17 [phone]

## 2017-07-31 NOTE — ED Triage Notes (Signed)
Pt c/o non-radiating chest pain and productive cough x 1 week. Denies shortness of breath/nausea/vomiting/diaphoresis.

## 2017-07-31 NOTE — Discharge Instructions (Addendum)
It was my pleasure taking care of you today!   Please take all of your antibiotics until finished!  Tessalon as needed for cough.   Continue to use your inhaler as needed.   Return to ER for new or worsening symptoms, any additional concerns.

## 2018-03-19 ENCOUNTER — Emergency Department (HOSPITAL_COMMUNITY): Admission: EM | Admit: 2018-03-19 | Discharge: 2018-03-19 | Discharge status: Absent without leave | Payer: 59

## 2018-06-01 IMAGING — DX DG CHEST 2V
2 series · 2 of 2 positions shown · non-contrast
Comparison: 09/03/2016

CLINICAL DATA: Chest pain extending into the left shoulder and all
arm starting last night.

EXAM:
CHEST  2 VIEW

[chest pa]
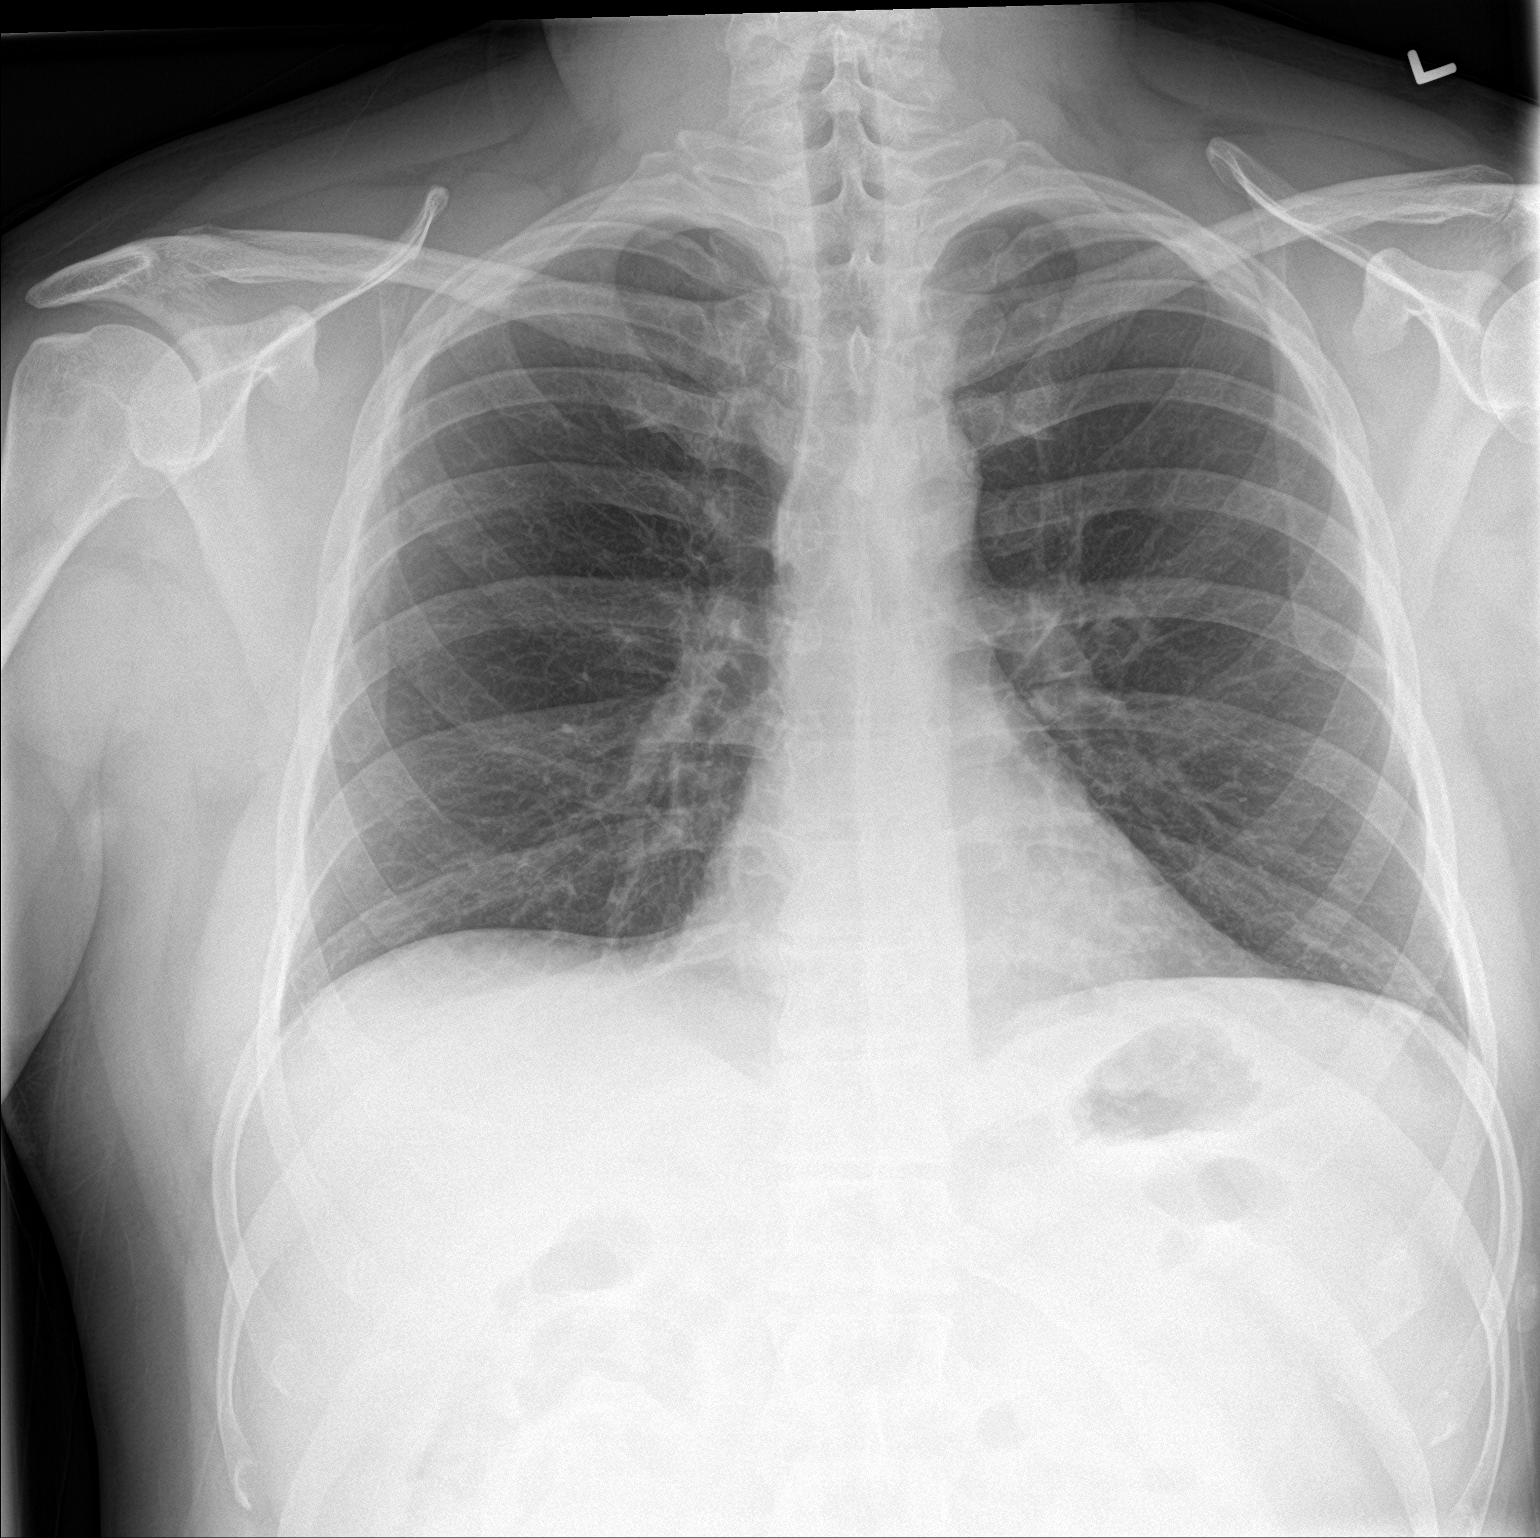

[chest lat]
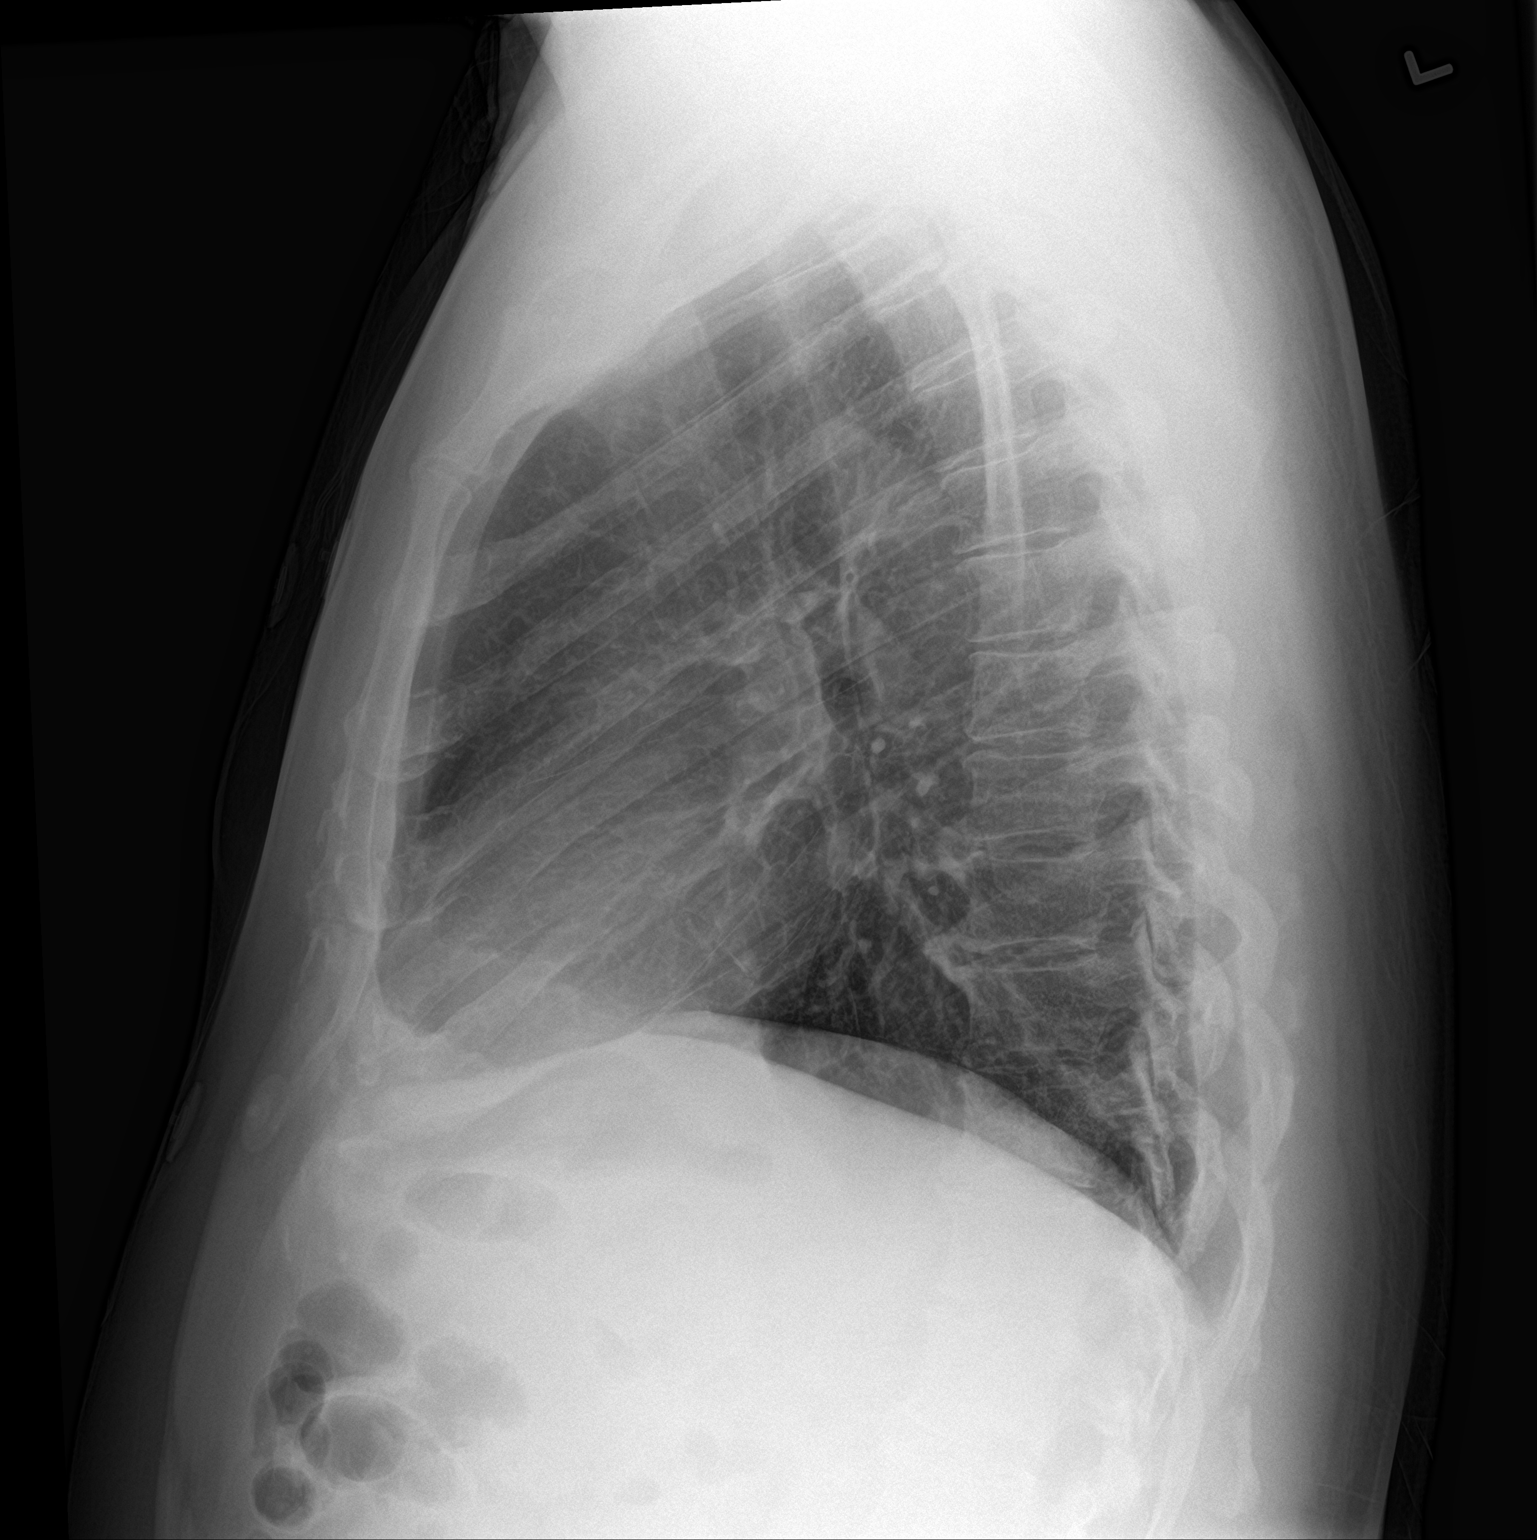

[2 of 2 positions shown; findings below may reference images not displayed]

FINDINGS: The heart size and mediastinal contours are within normal limits.
Both lungs are clear. The visualized skeletal structures are
unremarkable.
IMPRESSION: No active cardiopulmonary disease.

## 2018-06-01 IMAGING — DX DG SHOULDER 2+V*L*
3 series · 3 of 3 positions shown · non-contrast
Comparison: 09/03/2016 CXR which partially includes the left
shoulder

CLINICAL DATA: Left proximal humeral pain on and off since [place].

EXAM:
LEFT SHOULDER - 2+ VIEW

[shoulder grashey]
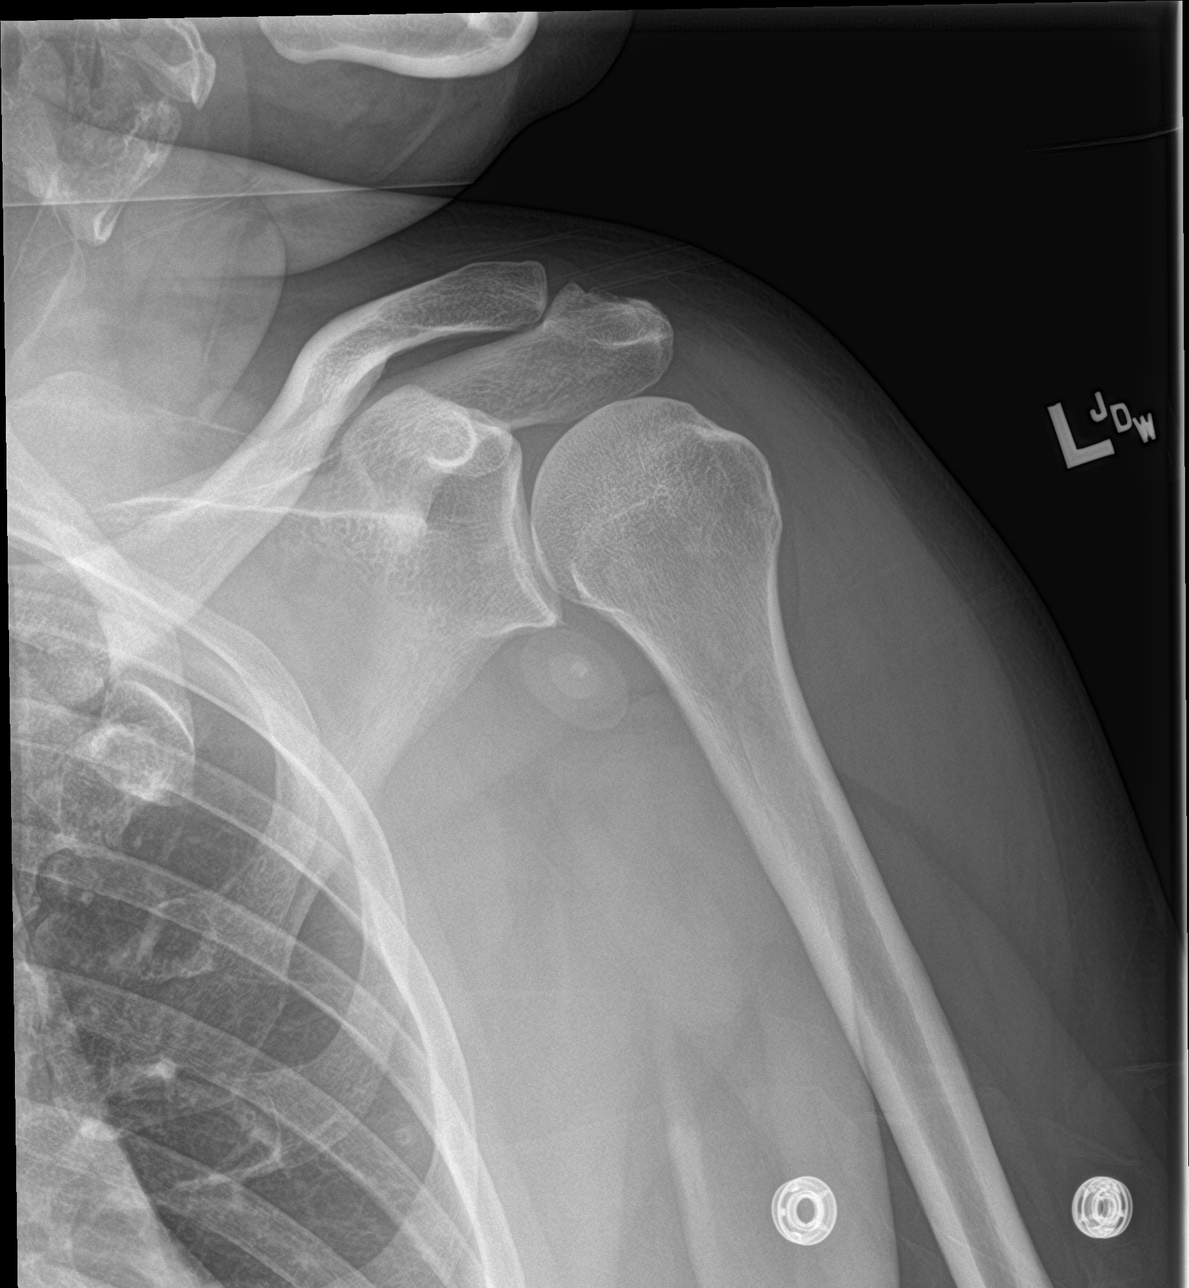

[shoulder y view]
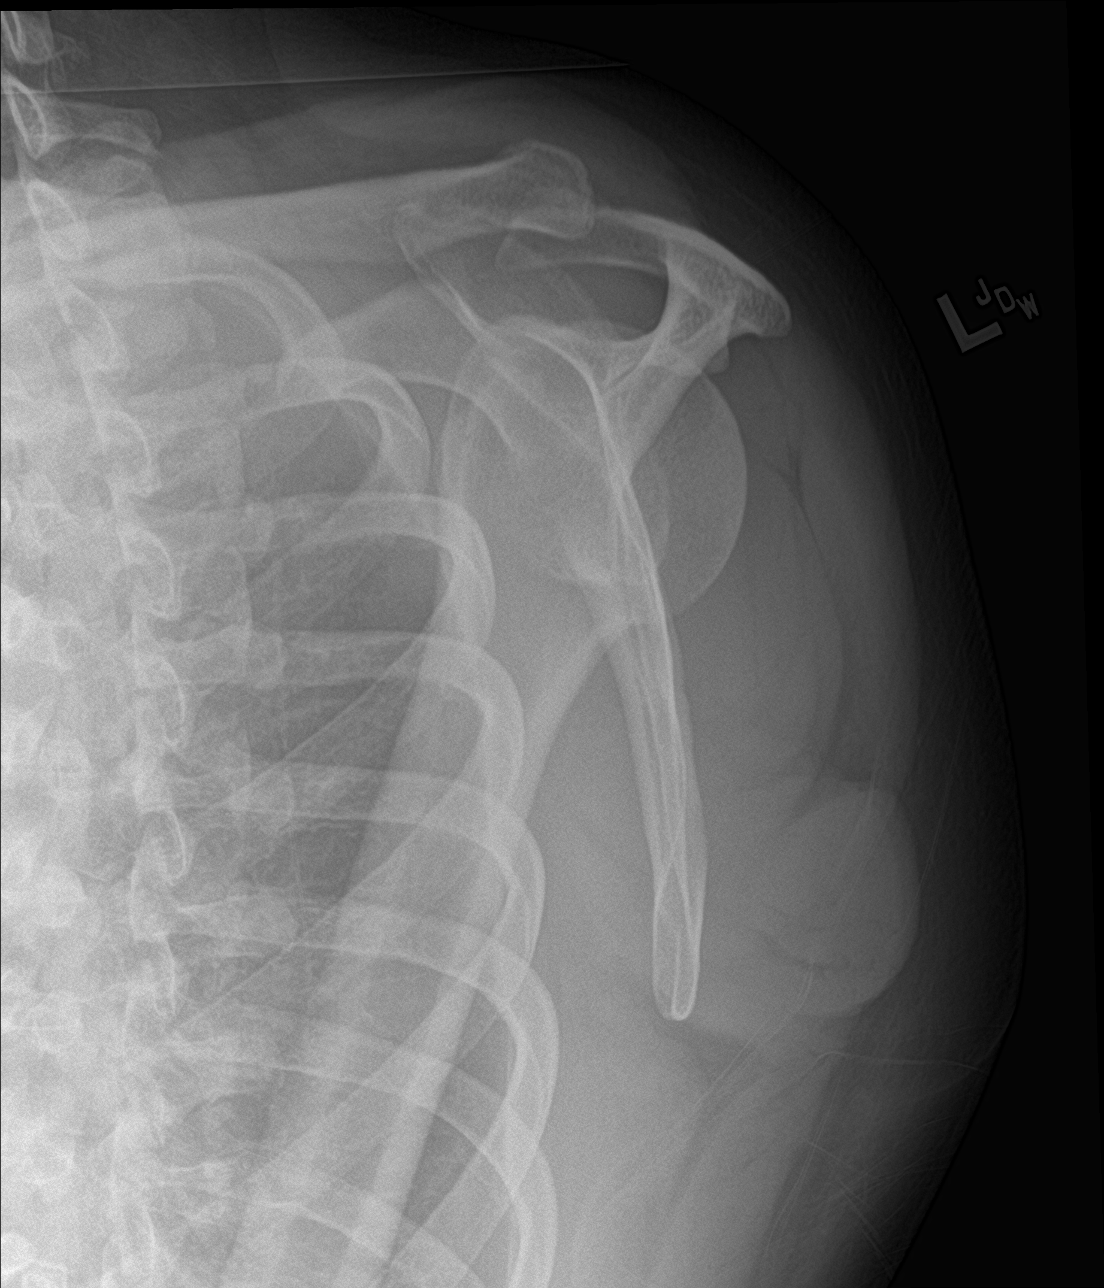

[shoulder axillary]
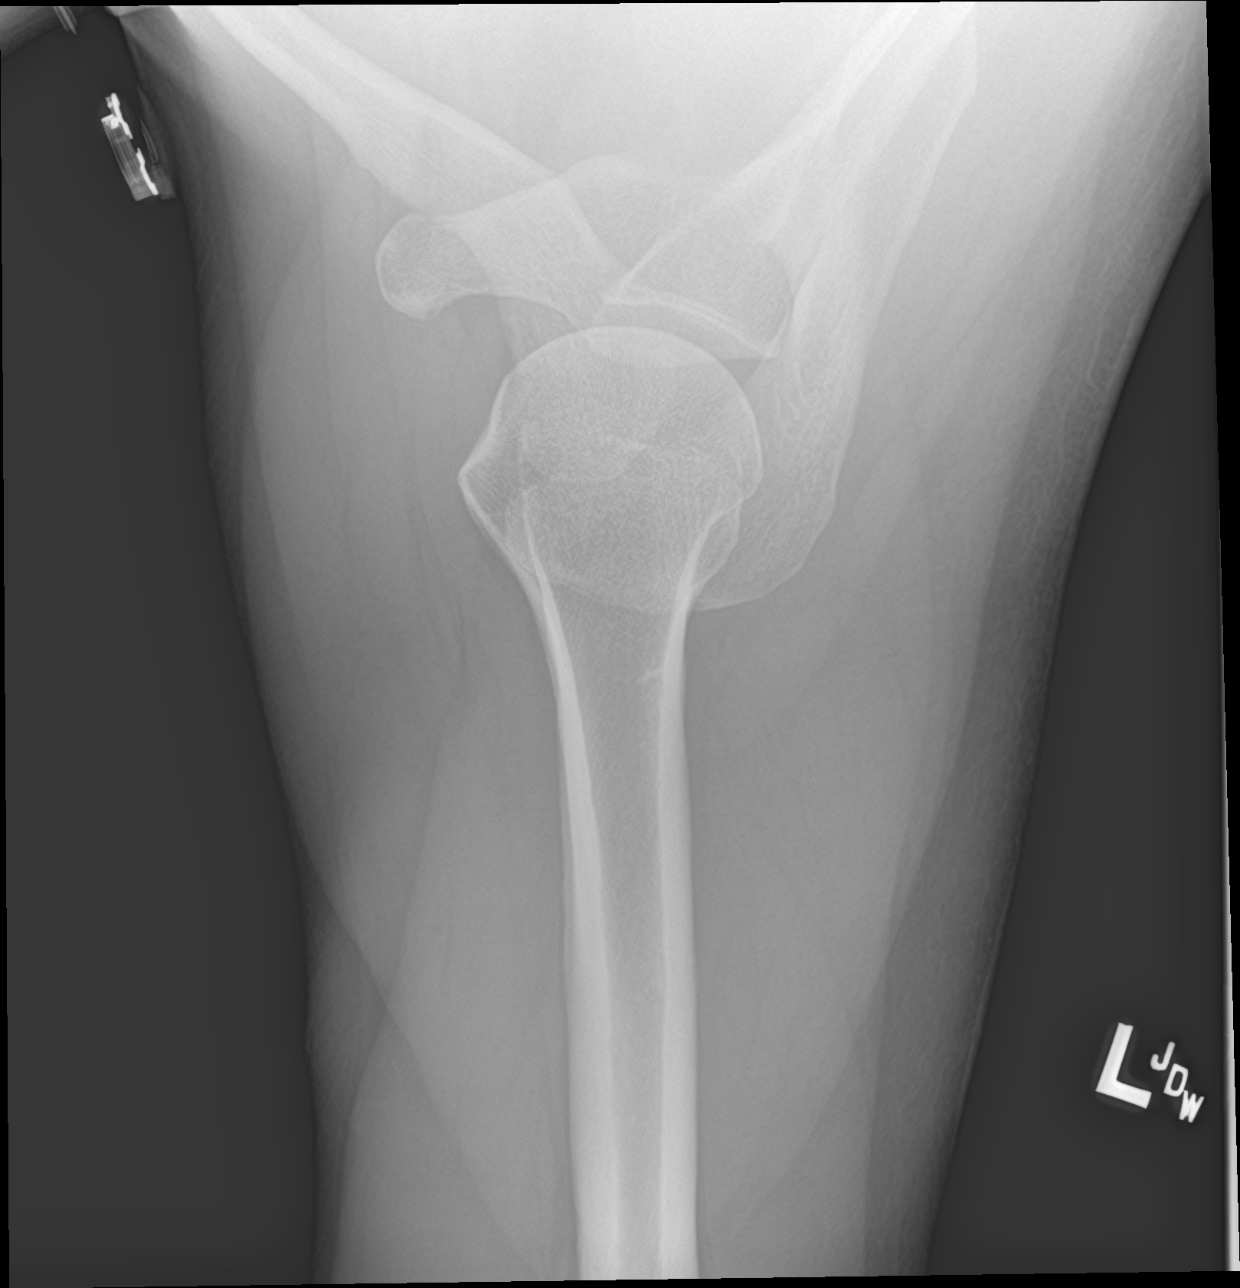

[3 of 3 positions shown; findings below may reference images not displayed]

FINDINGS: There is no evidence of fracture or dislocation. There is no
evidence of arthropathy or other focal bone abnormality. Soft
tissues are unremarkable.
IMPRESSION: Negative.

## 2020-01-14 ENCOUNTER — Ambulatory Visit (INDEPENDENT_AMBULATORY_CARE_PROVIDER_SITE_OTHER): Payer: 59 | Admitting: Pulmonary Disease

## 2020-01-14 ENCOUNTER — Encounter: Payer: Self-pay | Admitting: Pulmonary Disease

## 2020-01-14 ENCOUNTER — Other Ambulatory Visit: Payer: Self-pay

## 2020-01-14 VITALS — BP 124/76 | HR 100 | Temp 97.8°F | Ht 71.0 in | Wt 232.0 lb

## 2020-01-14 DIAGNOSIS — R0789 Other chest pain: Secondary | ICD-10-CM

## 2020-01-14 DIAGNOSIS — G4733 Obstructive sleep apnea (adult) (pediatric): Secondary | ICD-10-CM

## 2020-01-14 NOTE — Progress Notes (Signed)
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 albuterol (PROAIR HFA) 108 (90 Base) MCG/ACT inhaler Inhale 2 puffs into the lungs every 6 (six) hours as needed for wheezing or shortness of breath.  Marland Kitchen aspirin EC 81 MG tablet Take 162-243 mg by mouth every 6 (six) hours as needed (pain/headache).  Marland Kitchen ibuprofen (ADVIL,MOTRIN) 600 MG tablet Take 1 tablet (600 mg total) by mouth every 6 (six) hours as needed.  Marland Kitchen LORazepam (ATIVAN) 1 MG tablet Take 1 mg by mouth See admin instructions. Take 1 tablet (1 mg) by mouth daily at bedtime, may also take 1 tablet (1 mg) twice daily as needed for anxiety  . metFORMIN (GLUCOPHAGE) 1000 MG tablet Take 1,000 mg by mouth 2 (two) times daily with a meal.  . pseudoephedrine (SUDAFED) 30 MG tablet Take 60 mg by mouth every 4 (four) hours as needed for congestion.  . benzonatate (TESSALON) 100 MG capsule Take 1 capsule (100 mg total) by mouth every 8 (eight) hours. (Patient not taking: Reported on 01/14/2020)  . Chlorpheniramine Maleate (CHLORPHEN PO) Take 1 tablet by mouth daily.  . Cholecalciferol (VITAMIN D3 PO) Take 1 tablet by  mouth daily.  Marland Kitchen doxycycline (VIBRAMYCIN) 100 MG capsule Take 1 capsule (100 mg total) by mouth 2 (two) times daily. (Patient not taking: Reported on 01/14/2020)  . HYDROcodone-acetaminophen (NORCO/VICODIN) 5-325 MG tablet Take 1-2 tablets by mouth every 6 (six) hours as needed. (Patient not taking: Reported on 02/19/2017)  . ibuprofen (ADVIL,MOTRIN) 200 MG tablet Take 600 mg by mouth every 6 (six) hours as needed for headache (pain).  . Multiple Vitamin (MULTIVITAMIN WITH MINERALS) TABS tablet Take 1 tablet by mouth daily.  . naproxen sodium (ALEVE) 220 MG tablet Take 440 mg by mouth 2 (two) times daily as needed (pain/headache).  . zaleplon (SONATA) 5 MG capsule Take 5 mg by mouth at bedtime.   No facility-administered encounter medications on file as of 01/14/2020.    Allergies as of 01/14/2020 - Review Complete 01/14/2020  Allergen Reaction Noted  . Cyclobenzaprine Nausea And Vomiting 11/02/2015  . Prochlorperazine Rash 11/02/2015    Past Medical History:  Diagnosis Date  . Acid reflux   . Allergy    RHINITIS  . Anxiety   . Asthma   . Drug-seeking behavior   . GERD (gastroesophageal reflux disease)   . Hyperlipidemia   . Insomnia   . Pre-diabetes     No past surgical history on file.  Family History  Problem Relation Age of Onset  . Heart disease Father  CORNARY HEART DISEASE/ 2 BYPASS SURGERIES  . Cancer Father        PROSTATE  . Heart disease Paternal Grandfather        CORNARY HEART DISEASE  . Arthritis Paternal Grandfather     Social History   Socioeconomic History  . Marital status: Divorced    Spouse name: Not on file  . Number of children: Not on file  . Years of education: Not on file  . Highest education level: Not on file  Occupational History  . Not on file  Tobacco Use  . Smoking status: Current Every Day Smoker    Packs/day: 1.00    Years: 29.00    Pack years: 29.00    Types: Cigarettes  . Smokeless tobacco: Never Used  Substance and  Sexual Activity  . Alcohol use: No    Alcohol/week: 0.0 standard drinks  . Drug use: No  . Sexual activity: Not on file  Other Topics Concern  . Not on file  Social History Narrative  . Not on file   Social Determinants of Health   Financial Resource Strain:   . Difficulty of Paying Living Expenses:   Food Insecurity:   . Worried About Programme researcher, broadcasting/film/video in the Last Year:   . Barista in the Last Year:   Transportation Needs:   . Freight forwarder (Medical):   Marland Kitchen Lack of Transportation (Non-Medical):   Physical Activity:   . Days of Exercise per Week:   . Minutes of Exercise per Session:   Stress:   . Feeling of Stress :   Social Connections:   . Frequency of Communication with Friends and Family:   . Frequency of Social Gatherings with Friends and Family:   . Attends Religious Services:   . Active Member of Clubs or Organizations:   . Attends Banker Meetings:   Marland Kitchen Marital Status:   Intimate Partner Violence:   . Fear of Current or Ex-Partner:   . Emotionally Abused:   Marland Kitchen Physically Abused:   . Sexually Abused:     Review of Systems  Respiratory: Positive for choking.   Psychiatric/Behavioral: Positive for sleep disturbance.    Vitals:   01/14/20 1513  BP: 124/76  Pulse: 100  Temp: 97.8 F (36.6 C)  SpO2: 96%     Physical Exam  Constitutional: He appears well-developed and well-nourished.  HENT:  Head: Normocephalic and atraumatic.  Mallampati 2, crowded oropharynx  Eyes: Pupils are equal, round, and reactive to light. Right eye exhibits no discharge. Left eye exhibits no discharge.  Neck: No tracheal deviation present. No thyromegaly present.  Cardiovascular: Normal rate and regular rhythm.  Pulmonary/Chest: Effort normal and breath sounds normal. No respiratory distress. He has no wheezes. He has no rales. He exhibits no tenderness.  Musculoskeletal:        General: Normal range of motion.     Cervical back: Normal range of  motion and neck supple.  Neurological: He is alert.  Skin: Skin is warm.  Psychiatric: He has a normal mood and affect.   Results of the Epworth flowsheet 01/14/2020  Sitting and reading 0  Watching TV 0  Sitting, inactive in a public place (e.g. a theatre or a meeting) 0  As a passenger in a car for an hour without a break 3  Lying down to rest in the afternoon when circumstances permit 3  Sitting and talking to someone 0  Sitting quietly after a lunch without  alcohol 3  In a car, while stopped for a few minutes in traffic 0  Total score 9    Assessment:  Hypoactive significant obstructive sleep apnea  Daytime sleepiness  Right rib cage discomfort, history of pleurisy in the past  Pathophysiology of sleep disordered breathing discussed with the patient Treatment options for sleep disordered breathing discussed with the patient  Plan/Recommendations: Obtain chest x-ray  Risks of not treating obstructive sleep apnea discussed with the patient  Follow-up in about 3 months  Order home sleep study    Virl Diamond MD [facility] 01/14/2020, 3:44 PM  CC: [facility]

## 2020-01-14 NOTE — Patient Instructions (Signed)
Moderate to high probability of significant obstructive sleep apnea  We will schedule you for home sleep study We will update you as results become available  Treatment options as discussed  We will follow-up in about 2 to 3 months  Call with significant concerns Sleep Apnea Sleep apnea is a condition in which breathing pauses or becomes shallow during sleep. Episodes of sleep apnea usually last 10 seconds or longer, and they may occur as many as 20 times an hour. Sleep apnea disrupts your sleep and keeps your body from getting the rest that it needs. This condition can increase your risk of certain health problems, including:  Heart attack.  Stroke.  Obesity.  Diabetes.  Heart failure.  Irregular heartbeat. What are the causes? There are three kinds of sleep apnea:  Obstructive sleep apnea. This kind is caused by a blocked or collapsed airway.  Central sleep apnea. This kind happens when the part of the brain that controls breathing does not send the correct signals to the muscles that control breathing.  Mixed sleep apnea. This is a combination of obstructive and central sleep apnea. The most common cause of this condition is a collapsed or blocked airway. An airway can collapse or become blocked if:  Your throat muscles are abnormally relaxed.  Your tongue and tonsils are larger than normal.  You are overweight.  Your airway is smaller than normal. What increases the risk? You are more likely to develop this condition if you:  Are overweight.  Smoke.  Have a smaller than normal airway.  Are elderly.  Are male.  Drink alcohol.  Take sedatives or tranquilizers.  Have a family history of sleep apnea. What are the signs or symptoms? Symptoms of this condition include:  Trouble staying asleep.  Daytime sleepiness and tiredness.  Irritability.  Loud snoring.  Morning headaches.  Trouble concentrating.  Forgetfulness.  Decreased interest in  sex.  Unexplained sleepiness.  Mood swings.  Personality changes.  Feelings of depression.  Waking up often during the night to urinate.  Dry mouth.  Sore throat. How is this diagnosed? This condition may be diagnosed with:  A medical history.  A physical exam.  A series of tests that are done while you are sleeping (sleep study). These tests are usually done in a sleep lab, but they may also be done at home. How is this treated? Treatment for this condition aims to restore normal breathing and to ease symptoms during sleep. It may involve managing health issues that can affect breathing, such as high blood pressure or obesity. Treatment may include:  Sleeping on your side.  Using a decongestant if you have nasal congestion.  Avoiding the use of depressants, including alcohol, sedatives, and narcotics.  Losing weight if you are overweight.  Making changes to your diet.  Quitting smoking.  Using a device to open your airway while you sleep, such as: ? An oral appliance. This is a custom-made mouthpiece that shifts your lower jaw forward. ? A continuous positive airway pressure (CPAP) device. This device blows air through a mask when you breathe out (exhale). ? A nasal expiratory positive airway pressure (EPAP) device. This device has valves that you put into each nostril. ? A bi-level positive airway pressure (BPAP) device. This device blows air through a mask when you breathe in (inhale) and breathe out (exhale).  Having surgery if other treatments do not work. During surgery, excess tissue is removed to create a wider airway. It is important to get  treatment for sleep apnea. Without treatment, this condition can lead to:  High blood pressure.  Coronary artery disease.  In men, an inability to achieve or maintain an erection (impotence).  Reduced thinking abilities. Follow these instructions at home: Lifestyle  Make any lifestyle changes that your health care  provider recommends.  Eat a healthy, well-balanced diet.  Take steps to lose weight if you are overweight.  Avoid using depressants, including alcohol, sedatives, and narcotics.  Do not use any products that contain nicotine or tobacco, such as cigarettes, e-cigarettes, and chewing tobacco. If you need help quitting, ask your health care provider. General instructions  Take over-the-counter and prescription medicines only as told by your health care provider.  If you were given a device to open your airway while you sleep, use it only as told by your health care provider.  If you are having surgery, make sure to tell your health care provider you have sleep apnea. You may need to bring your device with you.  Keep all follow-up visits as told by your health care provider. This is important. Contact a health care provider if:  The device that you received to open your airway during sleep is uncomfortable or does not seem to be working.  Your symptoms do not improve.  Your symptoms get worse. Get help right away if:  You develop: ? Chest pain. ? Shortness of breath. ? Discomfort in your back, arms, or stomach.  You have: ? Trouble speaking. ? Weakness on one side of your body. ? Drooping in your face. These symptoms may represent a serious problem that is an emergency. Do not wait to see if the symptoms will go away. Get medical help right away. Call your local emergency services (911 in the [place] Do not drive yourself to the hospital. Summary  Sleep apnea is a condition in which breathing pauses or becomes shallow during sleep.  The most common cause is a collapsed or blocked airway.  The goal of treatment is to restore normal breathing and to ease symptoms during sleep. This information is not intended to replace advice given to you by your health care provider. Make sure you discuss any questions you have with your health care provider. Document Revised: 02/12/2019  Document Reviewed: 04/23/2018 Elsevier Patient Education  [place].

## 2020-01-19 ENCOUNTER — Encounter: Payer: Self-pay | Admitting: Pulmonary Disease

## 2020-01-21 ENCOUNTER — Telehealth: Payer: Self-pay | Admitting: Pulmonary Disease

## 2020-01-21 NOTE — Telephone Encounter (Signed)
LM on pt's VM to schedule HST.   °

## 2020-01-22 NOTE — Telephone Encounter (Signed)
Pt called back.  Scheduled for 5/14 @ 4:30.  Nothing further needed at this time.

## 2020-01-23 ENCOUNTER — Ambulatory Visit: Payer: 59

## 2020-01-23 ENCOUNTER — Other Ambulatory Visit: Payer: Self-pay

## 2020-01-23 DIAGNOSIS — G4733 Obstructive sleep apnea (adult) (pediatric): Secondary | ICD-10-CM

## 2020-01-29 ENCOUNTER — Telehealth: Payer: Self-pay | Admitting: Pulmonary Disease

## 2020-01-29 NOTE — Telephone Encounter (Signed)
Call patient  Sleep study result  Date of study: 01/25/2020   Impression: Suboptimal study based off of patient's experience of poor quality sleep Low AHI 4.4 with poor quality sleep may not have optimally captured the severity of sleep disordered breathing Mild oxygen desaturations  Recommendation: Order in lab polysomnogram for moderate probability of significant sleep disordered breathing  Sleep position optimization by encouraging sleeping lateral position, elevating head of bed about 30 degrees may help Caution against driving when sleepy and against medications with sedative effects  Follow-up following in lab polysomnogram

## 2020-02-02 NOTE — Telephone Encounter (Signed)
Attempted to call patient, left message. Sleep study in Dr. Trena Platt mailbox.

## 2020-02-03 ENCOUNTER — Telehealth: Payer: Self-pay | Admitting: Pulmonary Disease

## 2020-02-03 DIAGNOSIS — G4733 Obstructive sleep apnea (adult) (pediatric): Secondary | ICD-10-CM

## 2020-02-03 NOTE — Telephone Encounter (Signed)
Tomma Lightning, MD  6:17 PM Note   Call patient  Sleep study result  Date of study: 01/25/2020   Impression: Suboptimal study based off of patient's experience of poor quality sleep Low AHI 4.4 with poor quality sleep may not have optimally captured the severity of sleep disordered breathing Mild oxygen desaturations  Recommendation: Order in lab polysomnogram for moderate probability of significant sleep disordered breathing  Sleep position optimization by encouraging sleeping lateral position, elevating head of bed about 30 degrees may help Caution against driving when sleepy and against medications with sedative effects  Follow-up following in lab polysomnogram     Called and spoke with pt letting him know the info stated by AO about the results of the HST and that AO recommends in-lab study. Pt verbalized understanding. Order has been placed.nothing further needed.

## 2020-02-21 ENCOUNTER — Other Ambulatory Visit (HOSPITAL_COMMUNITY): Payer: 59

## 2020-02-24 ENCOUNTER — Encounter (HOSPITAL_BASED_OUTPATIENT_CLINIC_OR_DEPARTMENT_OTHER): Payer: 59 | Admitting: Pulmonary Disease

## 2020-04-26 ENCOUNTER — Other Ambulatory Visit: Payer: Self-pay

## 2020-04-26 ENCOUNTER — Ambulatory Visit (HOSPITAL_BASED_OUTPATIENT_CLINIC_OR_DEPARTMENT_OTHER): Payer: 59 | Attending: Pulmonary Disease | Admitting: Pulmonary Disease

## 2020-04-26 DIAGNOSIS — G4733 Obstructive sleep apnea (adult) (pediatric): Secondary | ICD-10-CM | POA: Diagnosis present

## 2020-04-27 ENCOUNTER — Encounter (HOSPITAL_BASED_OUTPATIENT_CLINIC_OR_DEPARTMENT_OTHER): Payer: 59 | Admitting: Pulmonary Disease

## 2020-05-01 ENCOUNTER — Telehealth: Payer: Self-pay | Admitting: Pulmonary Disease

## 2020-05-01 DIAGNOSIS — G4733 Obstructive sleep apnea (adult) (pediatric): Secondary | ICD-10-CM

## 2020-05-01 NOTE — Procedures (Signed)
POLYSOMNOGRAPHY  Last, First: Eddie, Gross MRN: [id number] Gender: Male Age (years): 44 Weight (lbs): 236 DOB: 01-17-1976 BMI: 33 Primary Care: No PCP Epworth Score: 15 Referring: Tomma Lightning MD Technician: York Sink Interpreting: Tomma Lightning MD Study Type: NPSG Ordered Study Type: NPSG Study date: 04/26/2020 Location: [facility] CLINICAL INFORMATION Eddie Gross is a 44 year old Male and was referred to the sleep center for evaluation of N/A. Indications include N/A.  MEDICATIONS Patient self administered medications include: N/A. Medications administered during study include No sleep medicine administered.  SLEEP STUDY TECHNIQUE A multi-channel overnight Polysomnography study was performed. The channels recorded and monitored were central and occipital EEG, electrooculogram (EOG), submentalis EMG (chin), nasal and oral airflow, thoracic and abdominal wall motion, anterior tibialis EMG, snore microphone, electrocardiogram, and a pulse oximetry. TECHNICIAN COMMENTS Comments added by Technician: Patient was restless all through the night. Comments added by Scorer: N/A SLEEP ARCHITECTURE The study was initiated at 7:51:37 AM and terminated at 1:46:41 PM. The total recorded time was 355.1 minutes. EEG confirmed total sleep time was 191.5 minutes yielding a sleep efficiency of 53.9%%. Sleep onset after lights out was 17.0 minutes with a REM latency of 122.5 minutes. The patient spent 23.0%% of the night in stage N1 sleep, 66.8%% in stage N2 sleep, 0.0%% in stage N3 and 10.2% in REM. Wake after sleep onset (WASO) was 146.6 minutes. The Arousal Index was 40.1/hour. RESPIRATORY PARAMETERS There were a total of 37 respiratory disturbances out of which 32 were apneas ( 32 obstructive, 0 mixed, 0 central) and 5 hypopneas. The apnea/hypopnea index (AHI) was 11.6 events/hour. The central sleep apnea index was 0.0 events/hour. The REM AHI was 15.4 events/hour and NREM AHI was  11.2 events/hour. The supine AHI was 20.2 events/hour and the non supine AHI was 3.1 supine during 49.70% of sleep. Respiratory disturbances were associated with oxygen desaturation down to a nadir of 89.0% during sleep. The mean oxygen saturation during the study was 94.3%. The cumulative time under 88% oxygen saturation was 5.5 minutes.  LEG MOVEMENT DATA The total leg movements were 24 with a resulting leg movement index of 7.5/hr .Associated arousal with leg movement index was 0.9/hr.  CARDIAC DATA The underlying cardiac rhythm was most consistent with sinus rhythm. Mean heart rate during sleep was 76.5 bpm. Additional rhythm abnormalities include None.  IMPRESSIONS - Mild Obstructive Sleep apnea(OSA) in the context of excessive daytime sleepiness. - EKG showed no cardiac abnormalities. - Mild Oxygen Desaturation - The patient snored with loud snoring volume. - No significant periodic leg movements(PLMs) during sleep, no significant associated arousals.   DIAGNOSIS - Obstructive Sleep Apnea (G47.33) - Nocturnal Hypoxemia (G47.36)   RECOMMENDATIONS - Therapeutic CPAP titration to determine optimal pressure required to alleviate sleep disordered breathing. - Autotitating CPAP with pressure settings of 5-15 will be appropriate for treatment. - Positional therapy avoiding supine position during sleep. - Avoid alcohol, sedatives and other CNS depressants that may worsen sleep apnea and disrupt normal sleep architecture. - Sleep hygiene should be reviewed to assess factors that may improve sleep quality. - Weight management and regular exercise should be initiated or continued.  [Electronically signed] 05/01/2020 11:49 AM  Virl Diamond MD NPI: [id number]

## 2020-05-01 NOTE — Telephone Encounter (Signed)
Call patient  Sleep study result  Date of study: 04/26/2020  Impression: Mild obstructive sleep apnea in the context of excessive daytime sleepiness  Recommendation: Auto titrating CPAP with a pressure settings of 5-15 will be appropriate for treatment Patient's mask of choice  Follow-up as previously scheduled

## 2020-05-03 NOTE — Telephone Encounter (Signed)
Patient contacted with results of home sleep study. DME order sent for new CPAP start, follow up recall in to see Dr. Wynona Neat in 2-3 months.

## 2021-06-23 ENCOUNTER — Encounter (HOSPITAL_BASED_OUTPATIENT_CLINIC_OR_DEPARTMENT_OTHER): Payer: Self-pay | Admitting: Obstetrics and Gynecology

## 2021-06-23 ENCOUNTER — Other Ambulatory Visit: Payer: Self-pay

## 2021-06-23 DIAGNOSIS — Z5321 Procedure and treatment not carried out due to patient leaving prior to being seen by health care provider: Secondary | ICD-10-CM | POA: Insufficient documentation

## 2021-06-23 DIAGNOSIS — N200 Calculus of kidney: Secondary | ICD-10-CM | POA: Diagnosis not present

## 2021-06-23 DIAGNOSIS — R109 Unspecified abdominal pain: Secondary | ICD-10-CM | POA: Diagnosis present

## 2021-06-23 LAB — CBC
HCT: 43.7 % (ref 39.0–52.0)
Hemoglobin: 15.5 g/dL (ref 13.0–17.0)
MCH: 32.3 pg (ref 26.0–34.0)
MCHC: 35.5 g/dL (ref 30.0–36.0)
MCV: 91 fL (ref 80.0–100.0)
Platelets: 209 10*3/uL (ref 150–400)
RBC: 4.8 MIL/uL (ref 4.22–5.81)
RDW: 13 % (ref 11.5–15.5)
WBC: 12.8 10*3/uL — ABNORMAL HIGH (ref 4.0–10.5)
nRBC: 0 % (ref 0.0–0.2)

## 2021-06-23 LAB — URINALYSIS, ROUTINE W REFLEX MICROSCOPIC
Bilirubin Urine: NEGATIVE
Glucose, UA: >1000 mg/dL — AB
Hgb urine dipstick: NEGATIVE
Ketones, ur: NEGATIVE mg/dL
Leukocytes,Ua: NEGATIVE
Nitrite: NEGATIVE
Protein, ur: NEGATIVE mg/dL
Specific Gravity, Urine: 1.023 (ref 1.005–1.030)
pH: 5.5 (ref 5.0–8.0)

## 2021-06-23 NOTE — ED Triage Notes (Signed)
Patient reports right sided flank pain, denies blood in urine or hx of kidney stones. Reports significant increase in pain since start on Saturday.

## 2021-06-24 ENCOUNTER — Emergency Department (HOSPITAL_BASED_OUTPATIENT_CLINIC_OR_DEPARTMENT_OTHER)
Admission: EM | Admit: 2021-06-24 | Discharge: 2021-06-24 | Disposition: A | Discharge status: Home / Self Care | Payer: 59 | Attending: Emergency Medicine | Admitting: Emergency Medicine

## 2021-06-24 LAB — BASIC METABOLIC PANEL
Anion gap: 11 (ref 5–15)
BUN: 20 mg/dL (ref 6–20)
CO2: 22 mmol/L (ref 22–32)
Calcium: 9.3 mg/dL (ref 8.9–10.3)
Chloride: 103 mmol/L (ref 98–111)
Creatinine, Ser: 1.04 mg/dL (ref 0.61–1.24)
GFR, Estimated: >60 mL/min (ref >60)
Glucose, Bld: 85 mg/dL (ref 70–99)
Potassium: 4.1 mmol/L (ref 3.5–5.1)
Sodium: 136 mmol/L (ref 135–145)

## 2021-06-24 NOTE — ED Notes (Signed)
Patient called to Examination Room for EDP Assessment and VS Reassessment with no Response. Registration Staff states Patient left earlier and did not return. Patient will be discharged accordingly.

## 2021-08-11 ENCOUNTER — Other Ambulatory Visit: Payer: Self-pay | Admitting: Rehabilitation

## 2021-08-11 DIAGNOSIS — M5136 Other intervertebral disc degeneration, lumbar region: Secondary | ICD-10-CM

## 2021-08-11 DIAGNOSIS — M4316 Spondylolisthesis, lumbar region: Secondary | ICD-10-CM

## 2021-08-11 DIAGNOSIS — M5416 Radiculopathy, lumbar region: Secondary | ICD-10-CM

## 2021-09-05 ENCOUNTER — Inpatient Hospital Stay: Admission: RE | Admit: 2021-09-05 | Payer: 59 | Source: Ambulatory Visit

## 2021-09-20 ENCOUNTER — Ambulatory Visit
Admission: RE | Admit: 2021-09-20 | Discharge: 2021-09-20 | Disposition: A | Discharge status: Home / Self Care | Payer: 59 | Source: Ambulatory Visit | Attending: Rehabilitation | Admitting: Rehabilitation

## 2021-09-20 ENCOUNTER — Other Ambulatory Visit: Payer: Self-pay

## 2021-09-20 DIAGNOSIS — M4316 Spondylolisthesis, lumbar region: Secondary | ICD-10-CM

## 2021-09-20 DIAGNOSIS — M5416 Radiculopathy, lumbar region: Secondary | ICD-10-CM

## 2021-09-20 DIAGNOSIS — M5136 Other intervertebral disc degeneration, lumbar region: Secondary | ICD-10-CM

## 2021-09-20 DIAGNOSIS — M51369 Other intervertebral disc degeneration, lumbar region without mention of lumbar back pain or lower extremity pain: Secondary | ICD-10-CM

## 2021-09-21 ENCOUNTER — Other Ambulatory Visit: Payer: 59

## 2022-05-04 ENCOUNTER — Other Ambulatory Visit: Payer: Self-pay | Admitting: Physician Assistant

## 2022-05-04 ENCOUNTER — Ambulatory Visit (INDEPENDENT_AMBULATORY_CARE_PROVIDER_SITE_OTHER): Payer: Self-pay

## 2022-05-04 DIAGNOSIS — R52 Pain, unspecified: Secondary | ICD-10-CM

## 2022-05-04 DIAGNOSIS — S9781XA Crushing injury of right foot, initial encounter: Secondary | ICD-10-CM

## 2023-11-01 ENCOUNTER — Ambulatory Visit: Payer: Medicaid Other | Admitting: Orthopedic Surgery

## 2024-07-14 ENCOUNTER — Encounter: Payer: Self-pay | Admitting: Radiology
